# Patient Record
Sex: Female | Born: 1985
Health system: Western US, Academic
[De-identification: ages and names within clinical notes are randomized; demographics above are authoritative.]

## PROBLEM LIST (undated history)

## (undated) ENCOUNTER — Inpatient Hospital Stay: Payer: Self-pay

## (undated) DIAGNOSIS — O1495 Unspecified pre-eclampsia, complicating the puerperium: Secondary | ICD-10-CM

## (undated) HISTORY — DX: Unspecified pre-eclampsia, complicating the puerperium: O14.95

## (undated) HISTORY — PX: NO PAST SURGERIES: SHX2092

---

## 2010-07-05 ENCOUNTER — Inpatient Hospital Stay: Payer: Self-pay

## 2010-07-13 ENCOUNTER — Inpatient Hospital Stay: Payer: Self-pay

## 2012-08-16 ENCOUNTER — Encounter: Payer: Self-pay | Admitting: Obstetrics and Gynecology

## 2012-08-30 ENCOUNTER — Encounter: Payer: Self-pay | Admitting: Maternal & Fetal Medicine

## 2012-09-11 ENCOUNTER — Encounter: Payer: Self-pay | Admitting: Pediatrics

## 2012-09-11 DIAGNOSIS — Z8759 Personal history of other complications of pregnancy, childbirth and the puerperium: Secondary | ICD-10-CM | POA: Insufficient documentation

## 2012-09-27 ENCOUNTER — Encounter: Payer: Self-pay | Admitting: Obstetrics and Gynecology

## 2012-10-03 ENCOUNTER — Encounter: Payer: Self-pay | Admitting: Obstetrics and Gynecology

## 2012-10-31 ENCOUNTER — Inpatient Hospital Stay: Payer: Self-pay

## 2012-10-31 LAB — CBC WITH DIFFERENTIAL/PLATELET
Basophil %: 0.1 %
Eosinophil #: 0.1 10*3/uL (ref 0.0–0.7)
Eosinophil %: 0.6 %
HGB: 11.8 g/dL — ABNORMAL LOW (ref 12.0–16.0)
Lymphocyte %: 15.8 %
MCHC: 36.7 g/dL — ABNORMAL HIGH (ref 32.0–36.0)
MCV: 91 fL (ref 80–100)
Monocyte #: 0.5 x10 3/mm (ref 0.2–0.9)
Monocyte %: 5.9 %
Neutrophil %: 77.6 %
RBC: 3.53 10*6/uL — ABNORMAL LOW (ref 3.80–5.20)
WBC: 9.2 10*3/uL (ref 3.6–11.0)

## 2012-10-31 LAB — APTT: Activated PTT: 29 secs (ref 23.6–35.9)

## 2012-10-31 LAB — FIBRIN DEGRADATION PROD.(ARMC ONLY): Fibrin Degradation Prod.: >40 ug/ml (ref 2.1–7.7)

## 2012-11-01 LAB — CBC WITH DIFFERENTIAL/PLATELET
Basophil %: 0.2 %
Eosinophil %: 0.4 %
Lymphocyte #: 1.4 10*3/uL (ref 1.0–3.6)
MCH: 32.5 pg (ref 26.0–34.0)
MCV: 93 fL (ref 80–100)
Monocyte #: 1 x10 3/mm — ABNORMAL HIGH (ref 0.2–0.9)
Monocyte %: 7 %
Neutrophil %: 82.1 %
Platelet: 129 10*3/uL — ABNORMAL LOW (ref 150–440)
RBC: 3.61 10*6/uL — ABNORMAL LOW (ref 3.80–5.20)

## 2012-11-02 LAB — HEMATOCRIT: HCT: 28.6 % — ABNORMAL LOW (ref 35.0–47.0)

## 2012-11-04 LAB — FIBRINOGEN: Fibrinogen: 302 mg/dL (ref 210–470)

## 2012-11-04 LAB — URINALYSIS, COMPLETE
Bacteria: NONE SEEN
Bilirubin,UR: NEGATIVE
Glucose,UR: NEGATIVE mg/dL (ref 0–75)
Ketone: NEGATIVE
Leukocyte Esterase: NEGATIVE
Nitrite: NEGATIVE
Protein: NEGATIVE
RBC,UR: 92 /HPF (ref 0–5)
Specific Gravity: 1.005 (ref 1.003–1.030)

## 2012-11-04 LAB — COMPREHENSIVE METABOLIC PANEL
Albumin: 2.7 g/dL — ABNORMAL LOW (ref 3.4–5.0)
Alkaline Phosphatase: 115 U/L (ref 50–136)
BUN: 5 mg/dL — ABNORMAL LOW (ref 7–18)
Bilirubin,Total: 0.3 mg/dL (ref 0.2–1.0)
Calcium, Total: 8.6 mg/dL (ref 8.5–10.1)
Chloride: 107 mmol/L (ref 98–107)
EGFR (African American): >60
Osmolality: 275 (ref 275–301)
Potassium: 3.8 mmol/L (ref 3.5–5.1)
SGOT(AST): 57 U/L — ABNORMAL HIGH (ref 15–37)

## 2012-11-04 LAB — CBC
HGB: 12.2 g/dL (ref 12.0–16.0)
MCH: 33.1 pg (ref 26.0–34.0)
MCHC: 36.1 g/dL — ABNORMAL HIGH (ref 32.0–36.0)
MCV: 92 fL (ref 80–100)
RBC: 3.7 10*6/uL — ABNORMAL LOW (ref 3.80–5.20)

## 2012-11-04 LAB — MAGNESIUM: Magnesium: 1.2 mg/dL — ABNORMAL LOW

## 2012-11-04 LAB — APTT: Activated PTT: 31.3 secs (ref 23.6–35.9)

## 2012-11-04 LAB — PHOSPHORUS: Phosphorus: 5 mg/dL — ABNORMAL HIGH (ref 2.5–4.9)

## 2012-11-04 LAB — TROPONIN I: Troponin-I: <0.02 ng/mL

## 2012-11-04 LAB — PROTIME-INR: Prothrombin Time: 12.2 secs (ref 11.5–14.7)

## 2012-11-05 ENCOUNTER — Observation Stay: Payer: Self-pay | Admitting: Obstetrics and Gynecology

## 2012-11-05 LAB — AEROBIC CULTURE

## 2012-11-05 LAB — PIH PROFILE
Anion Gap: 6 — ABNORMAL LOW (ref 7–16)
BUN: 4 mg/dL — ABNORMAL LOW (ref 7–18)
Calcium, Total: 8.1 mg/dL — ABNORMAL LOW (ref 8.5–10.1)
Chloride: 110 mmol/L — ABNORMAL HIGH (ref 98–107)
EGFR (Non-African Amer.): >60
Glucose: 80 mg/dL (ref 65–99)
HCT: 32.3 % — ABNORMAL LOW (ref 35.0–47.0)
MCH: 33.2 pg (ref 26.0–34.0)
MCV: 93 fL (ref 80–100)
Osmolality: 277 (ref 275–301)
Potassium: 3.7 mmol/L (ref 3.5–5.1)
RDW: 13.4 % (ref 11.5–14.5)
WBC: 10.5 10*3/uL (ref 3.6–11.0)

## 2012-11-05 LAB — PROTEIN / CREATININE RATIO, URINE
Creatinine, Urine: 18.4 mg/dL — ABNORMAL LOW (ref 30.0–125.0)
Protein, Random Urine: 7 mg/dL (ref 0–12)

## 2012-11-05 LAB — MAGNESIUM: Magnesium: 5.4 mg/dL — ABNORMAL HIGH

## 2012-11-06 LAB — MAGNESIUM
Magnesium: 5.9 mg/dL — ABNORMAL HIGH
Magnesium: 6.7 mg/dL — ABNORMAL HIGH

## 2013-03-14 ENCOUNTER — Encounter: Payer: Self-pay | Admitting: Maternal & Fetal Medicine

## 2013-03-14 LAB — CBC WITH DIFFERENTIAL/PLATELET
Basophil #: 0 10*3/uL (ref 0.0–0.1)
Basophil %: 0.4 %
Eosinophil #: 0.1 10*3/uL (ref 0.0–0.7)
Eosinophil %: 1 %
HCT: 36.6 % (ref 35.0–47.0)
HGB: 12.9 g/dL (ref 12.0–16.0)
Lymphocyte #: 1.6 10*3/uL (ref 1.0–3.6)
Lymphocyte %: 19.7 %
MCH: 31.3 pg (ref 26.0–34.0)
MCHC: 35.1 g/dL (ref 32.0–36.0)
MCV: 89 fL (ref 80–100)
MONO ABS: 0.5 x10 3/mm (ref 0.2–0.9)
MONOS PCT: 5.8 %
Neutrophil #: 5.9 10*3/uL (ref 1.4–6.5)
Neutrophil %: 73.1 %
Platelet: 146 10*3/uL — ABNORMAL LOW (ref 150–440)
RBC: 4.1 10*6/uL (ref 3.80–5.20)
RDW: 13.1 % (ref 11.5–14.5)
WBC: 8 10*3/uL (ref 3.6–11.0)

## 2013-10-16 ENCOUNTER — Inpatient Hospital Stay: Payer: Self-pay | Admitting: Obstetrics and Gynecology

## 2013-10-16 LAB — CBC WITH DIFFERENTIAL/PLATELET
BASOS ABS: 0.1 10*3/uL (ref 0.0–0.1)
BASOS PCT: 0.7 %
EOS PCT: 0.7 %
Eosinophil #: 0.1 10*3/uL (ref 0.0–0.7)
HCT: 37.9 % (ref 35.0–47.0)
HGB: 12.6 g/dL (ref 12.0–16.0)
LYMPHS ABS: 2.1 10*3/uL (ref 1.0–3.6)
LYMPHS PCT: 20.4 %
MCH: 31.7 pg (ref 26.0–34.0)
MCHC: 33.3 g/dL (ref 32.0–36.0)
MCV: 95 fL (ref 80–100)
Monocyte #: 0.7 x10 3/mm (ref 0.2–0.9)
Monocyte %: 7.2 %
NEUTROS ABS: 7.4 10*3/uL — AB (ref 1.4–6.5)
Neutrophil %: 71 %
Platelet: 128 10*3/uL — ABNORMAL LOW (ref 150–440)
RBC: 3.97 10*6/uL (ref 3.80–5.20)
RDW: 13.7 % (ref 11.5–14.5)
WBC: 10.5 10*3/uL (ref 3.6–11.0)

## 2013-10-16 LAB — COMPREHENSIVE METABOLIC PANEL
ANION GAP: 9 (ref 7–16)
Albumin: 2.8 g/dL — ABNORMAL LOW (ref 3.4–5.0)
Alkaline Phosphatase: 331 U/L — ABNORMAL HIGH
BUN: 9 mg/dL (ref 7–18)
Bilirubin,Total: 0.4 mg/dL (ref 0.2–1.0)
CHLORIDE: 108 mmol/L — AB (ref 98–107)
Calcium, Total: 8.7 mg/dL (ref 8.5–10.1)
Co2: 23 mmol/L (ref 21–32)
Creatinine: 0.49 mg/dL — ABNORMAL LOW (ref 0.60–1.30)
EGFR (African American): >60
EGFR (Non-African Amer.): >60
GLUCOSE: 82 mg/dL (ref 65–99)
Osmolality: 277 (ref 275–301)
Potassium: 4 mmol/L (ref 3.5–5.1)
SGOT(AST): 31 U/L (ref 15–37)
SGPT (ALT): 28 U/L
SODIUM: 140 mmol/L (ref 136–145)
TOTAL PROTEIN: 6.8 g/dL (ref 6.4–8.2)

## 2013-10-17 LAB — HEMATOCRIT: HCT: 35.4 % (ref 35.0–47.0)

## 2014-04-25 NOTE — Consult Note (Signed)
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 by 6 week ultrasound with a EDC of 02/15/2013  uncomplicated pregnancy thus far   Past Obstetrical Hx 07/2010 spontaneous vaginal delivery female 8 lb 14 oz antepartum PUPPrash, postpartum preeclampsia requiring medicaiton, brief ICU stay  SAB  08/2009   Home Medications: Medication Instructions Status  Prenatal Multivitamins oral tablet 1 tab(s) orally once a day Active   Allergies:   Penicillin: Rash  Vital Signs/Notes:  Nursing Vital Signs:  **Vital Signs.:   14-Aug-14 11:57  Vital Signs Type Routine  Temperature Temperature (F) 98.3  Celsius 36.8  Temperature Source oral  Pulse Pulse 81  Respirations Respirations 18  Systolic BP Systolic BP 113  Diastolic BP (mmHg) Diastolic BP (mmHg) 68  Mean BP 83  Pulse Ox % Pulse Ox % 100  Pulse Ox Activity Level  At rest  Oxygen Delivery Room Air/ 21 %   Perinatal Consult:  LMP 29-Apr-2012   PGyn Hx conceived first cycle after OCPS    Past Medical History cont'd negative   PSurg Hx none   FHx colon cancer , Type 2 DM   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   Soc Hx married   Review Of Systems:  Subjective mild nausea   Medications/Allergies Reviewed Medications/Allergies reviewed     Additional Lab/Radiology Notes Urine C&S GBS pos   Impression/Recommendations:  Impression [facility]UP at 13 6/7 weeks [facility] - 50% risk of aneuploidy , increased risk of Congenital heart disease  h/o postpartum preeclampsia requiring BP meds  h/o macrosomia- LGA infant   Recommendations Genetic testing - cell free DNA today - f/u 2 weeks discuss amnio then  fetal echo at 19-22 weeks full gentics note to follow offered baby aspirin for h/o preeclampsia  early glucola after nausea passes   Plan:  Genetic  Counseling yes   Prenatal Diagnosis Options Amniocentesis, cell free DNA    Total Time Spent with Patient 15 minutes   >50% of visit spent in couseling/coordination of care yes   Office Use Only [id number]  Genetic Counseling (30 min unit), 99241  Level 1 (15min) NEW office consult prob focused   Coding Description: FETAL - 1st TRIMESTER INDICATION(S).   GENETIC SCREENING/COUNSELING INDICATION(S).   Abn First Trimester Screen or thick NT.  Electronic Signatures: Rondall AllegraLivingston, Kahlie Deutscher [place] (MD)  (Signed 14-Aug-14 13:59)  Authored: Referral, Home Medications, Allergies, Vital Signs/Notes, Consult, Exam, Lab/Radiology Notes, Impression, Plan, Billing, Coding Description   Last Updated: 14-Aug-14 13:59 by Rondall AllegraLivingston, Areon Cocuzza [place] (MD)

## 2014-04-26 NOTE — Consult Note (Signed)
Referral Information:  Reason for Referral History of prior pregnancy complicated by stillbirth in setting of cystic hygroma   Referring Physician [facility]   Prenatal Hx Elizabeth Kramer is a 29 year-old G4 P1111 at 8 5/7 weeks [facility] who presents for recommendations for this pregnancy. Our group had seen Elizabeth Kramer during her most recent pregnancy in 2014. She was found to have a large cystic hygroma. She had normal cell-free fetal DNA testing, normal anatomy scan and normal fetal echo. She then presented at 25 weeks with fetal demise.  She was induced and had an uncomplicated delivery.  The fetus had skin sloughing and hydropic changes.  The placenta was also hydropic.  Microarray testing on the fetus was normal. Autopsy was declined. Placental pathology demonstrated hydropic changes and no evidence of viral cytopathic changes, chorioamnionitis or funisitis.  Antibody screen was negative. Parvovirus testing suggested protection (IgG positive, IgM negative). MCV normal. Anticardiolipin antibody negative.  Elizabeth Kramer is currently pregnant and states things are going well. She denies vaginal bleeding or abdominal pain.   Past Obstetrical Hx G4 P1111 2011: First trimester SAB, no D&C, no complications 2012: spontaneous vaginal delivery at 39 6/7 weeks, spontaneous labor, female, 8 pounds 14 ounces, no complications at delivery but re-admitted on PPD 6 with preeclampsia. BP 194/109, required ICU care and 6 weeks of nifedipine 2014: See HPI.  Cystic hydroma. Normal anat scan. Normal fetal echo. Stillbirth at 25 weeks. Hydrops. Normal microarray. No evidence of triploidy on microarray. Readmitted PPD 3 with severe preeclampsia.   Home Medications:  Medication Instructions Status  Prenatal Multivitamins oral tablet 1 tab(s) orally once a day Active   Allergies:   Penicillin: Rash  Vital Signs/Notes:  Nursing Vital Signs:  **Vital Signs.:   12-Mar-15 12:47  Pulse Pulse 79  Systolic BP  Systolic BP 116  Diastolic BP (mmHg) Diastolic BP (mmHg) 59   Perinatal Consult:  PGyn Hx Denies history of abnormal paps or STDs    Past Medical History cont'd Denies a history of chronic hypertension, diabetes or endocrine disorders Two pregnancies complicated by postpartum severe preeclampsia   PSurg Hx None   FHx Denies FH of birth defects, genetic disorders, mental retardation, or recurrent misscarriage   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   Fever/Chills No    Cough No    Abdominal Pain No    Nausea/Vomiting No    Impression/Recommendations:  Impression 29 year-old G4 P1111 at 8 3/7 weeks with most recent pregnancy complicated by stillbirth at 25 weeks in setting of cystic hygroma and two pregnancies complicated by postpartum severe preeclampsia.   Recommendations 1. Prior pregnancy with cystic hygroma, hydrops and stillbirth.  The presentation at time of delivery was consistent with hydrops. As her antibody screen was negative, this represents non-immune hydrops, for which the differential diagnosis is large.  She decline autopsy but had a normal anatomy scan and normal fetal echo.  Her MCV was normal, so alpha thalassemia is unlikley,  Her parvovirus testing was suggestive of protection, thus parvovirus-mediated severe anemia is unlikely.  Euploid fetuses with cystic hygromas have an approximate 15-20% risk for fetal death.  The etiology of the cystic hygroma and resultant non-immune hydrops is not clear. This could represent a genetic condition (ie single gene disorder). As the etiology is not know, her recurrence risk could be extremely  low or up to 25%-50% if this represented a recessive or dominant condition.  Elizabeth Kramer asked about the utiligy of cell-free fetal DNA testing. We discussed that the test would not  be able to screen for whatever the condition was that lead to her cystic hygroma and hydrops as the microarray testing from that pregnancy was normal. She is thinking about proceeding with cell-free DNA testing. Recs below.  2. Severe postpartum preeclampsia.  Postpartum preeclampsia is uncommon.  Preeclampsia though is common in the setting of fetal hydrops (mirror syndrome). We discussed signs and symptoms of preeclampsia, need for close surveillance during pregnancy and the immediate postpartum period and the use of daily baby aspirin, starting at 12 weeks to decrease the risk of recurrent preeclampsia.   Plan:  Comment/Plan Recs: -First trimester screen to screen for cystic hygroma -Daily baby aspirin to start at 12 weeks -Detailed ultrasound at 17-18 weeks -We have sent antiphospholipid antibodies (anticardiolipin antibodies, anti beta 2 glycoprotein antibodies and lupus anticoagulant), CBC to check MCV and hemoglobin electrophoresis -Pt is considering cell-free fetal DNA testing -Follow fetal growth with regular ultrasound assessment during the pregnancy (screen for recurrent hydrops) -Ensure antibody screen is still negative    Total Time Spent with Patient 60 minutes   >50% of visit spent in couseling/coordination of care yes   Office Use Only 99244  Level 4 ( ) NEW office consult low complexity   Coding Description: OTHER: History of stillbirth (hydrops) and preeclampsia.  Electronic Signatures: Allicia Culley, [place] (MD)  (Signed 12-Mar-15 13:48)  Authored: Referral, Home Medications, Allergies, Vital Signs/Notes, Consult, Exam, Impression, Plan, Billing, Coding Description   Last Updated: 12-Mar-15 13:48 by Jackeline Gutknecht, [place] (MD)

## 2014-05-13 NOTE — H&P (Signed)
L&D Evaluation:  History:  HPI 29 yo G3P1011 [facility] 6745w5d [facility] presenting with no fetal movement since yesterday evening.  She has a home doppler and got heartones yesterday evening.  Also reports irregular contractions up 18-hr.  No LOF, no VB, no abdominal trauma.  [facility] cystic hygroma noted at 13 week 1st trimester NT scan.  She subsequently underwent cell free fetal DNA which returned low risk and XY.  Follow up ultrasounds did not show any other anatomic abnormalities and [facility]  [facility] revealed minimal residual fluid in the lateral posterior nuchal fold  The patient does have a history of prior preeclampsia with baseline 24-hr urine obtained at start of pregnancy 132   Presents with decreased fetal movement   Patient's Medical History No Chronic Illness   Patient's Surgical History none   Medications Pre Natal Vitamins    Allergies NKDA   Social History none    Family History Non-Contributory    ROS:  ROS All systems were reviewed.  HEENT, CNS, GI, GU, Respiratory, CV, Renal and Musculoskeletal systems were found to be normal.   Exam:  Vital Signs stable    General no apparent distress   Mental Status clear    Chest no increased work of breathing    Abdomen gravid, non-tender   Estimated Fetal Weight Average for gestational age   Fetal Position vtx on US   Edema no edema    Pelvic no external lesions, cervix closed and thick   Mebranes Intact   FHT absent   Other Bedside US reveals single non-viable IUP, no FHTs confirmed on doppler or color flow, fetal ascites is noted.   Impression:  Impression IUFD at 2545w5d   Plan:  Plan monitor contractions and for cervical change, monitor BP, fluids   Comments 1) IUFD     - discussed IUFD and cytogenetics given cystichygroma, we discussed false negative are possible on cell free fetal DNA      - IOL with  misoprostil     - morphine PCA for anagesia     - KB     - routine prenatal labs  2) PNL A+ / ABSC neg / RI / VZI / HIV neg / HBsAg neg / RPR NR / GBS bacteruria / early 1-hr 121 / Baseline 24-hr urine 132mg    Electronic Signatures: Lorrene ReidStaebler, Leno Mathes M (MD)  (Signed 29-Oct-14 14:47)  Authored: L&D Evaluation   Last Updated: 29-Oct-14 14:47 by Lorrene ReidStaebler, Shanti Agresti M (MD)

## 2014-05-13 NOTE — H&P (Signed)
L&D Evaluation:  History:  HPI -CC: worsening UCs -HPI: 29 y/o [id number] @ 39/4 with the above CC. Preg c/b h/o preterm IUFD due to non immune hydrops, GBS pos and h/o PP pre-eclampsia x 2.  No VB, LOF or decreased FM.   Medications Pre Natal Vitamins   Allergies PCN, rash   Social History none   Exam:  Vital Signs AF VS normal and stable   General no apparent distress   Pelvic 0330 4-5cm-->0550 7/90/0   Mebranes Intact   FHT 140 baseline, +accels, no decels, mod var   Ucx irregular   Plan:  Plan pt doing well   Comments *IUP: category I tracing -10/17 @ 35/2: 2971gm, 69%, AC 97%, nl AFI, VTX -normal 1hr GTT and prior infant 4025gm *Term labor: d/w pt likely augmentation with pit or AROM once 2nd dose of abx is on board. will inform Dr. Bonney AidStaebler of progress for delivery *Analgesia: s/p epidural *GBS pos: 2nd dose ancef at approx 11am *Gestational thrombocytopenia: normal CMP. likely need to keep until PPD#2 and see with close follow up on hospital discharge.  A pos//RI/VI/rpr pending/hiv neg/hepB neg/tdap UTD/pap: need to look up in EMR   Electronic Signatures: Alger BingPickens, Chelli Yerkes (MD)  (Signed 14-Oct-15 07:19)  Authored: L&D Evaluation   Last Updated: 14-Oct-15 07:19 by Seymour BingPickens, Lavone Barrientes (MD)

## 2015-07-06 IMAGING — US US OB DETAIL+14 WK - NRPT [facility]
1 series · 14 of 28 positions shown · non-contrast
Comparison: none

[Series 1: us ob detail+14 wk - nrpt [facility] · 0.28mm/px · 14 of 86 slices shown]
[im 4/86]
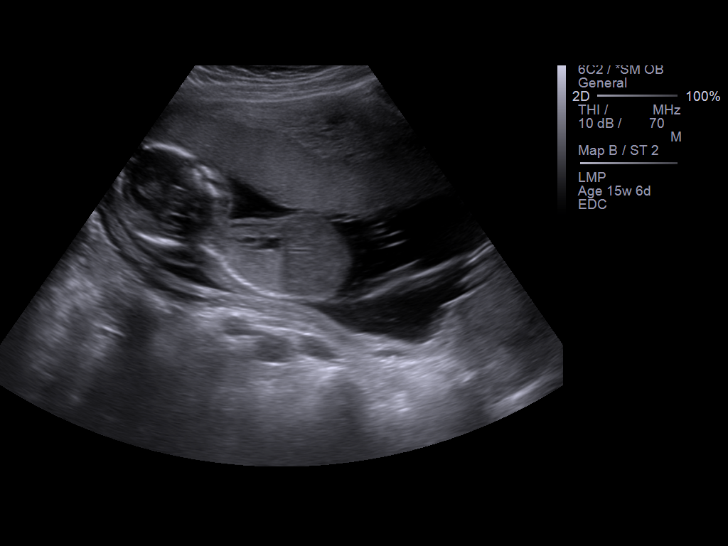
[im 10/86]
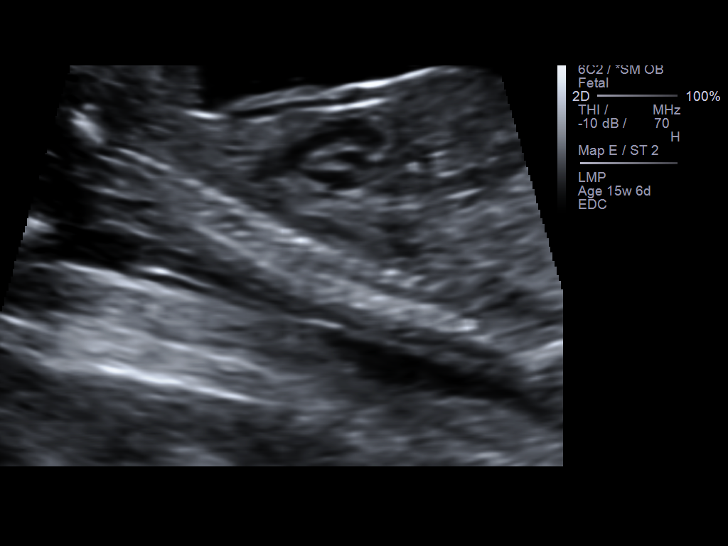
[im 16/86]
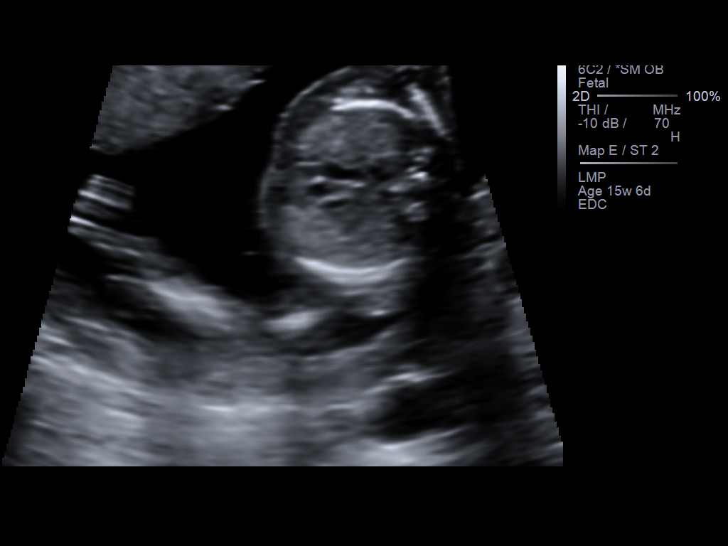
[im 23/86]
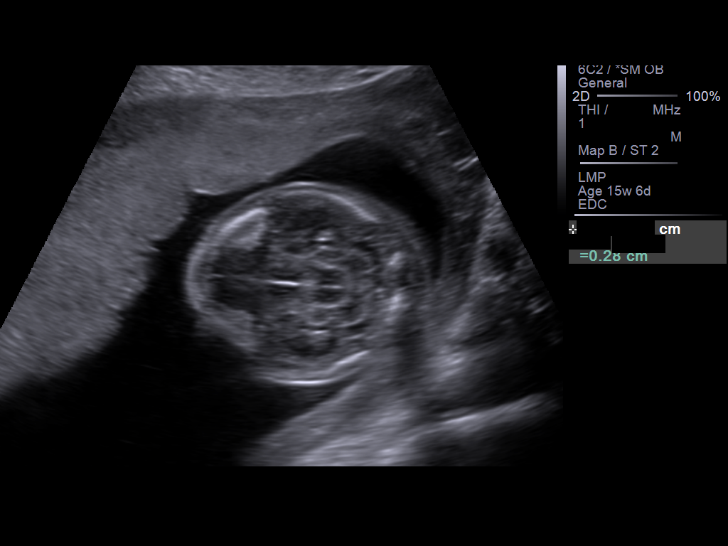
[im 29/86]
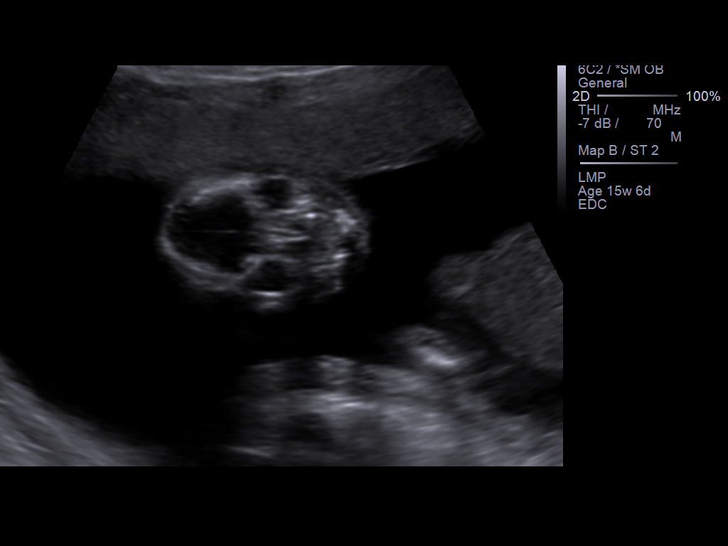
[im 35/86]
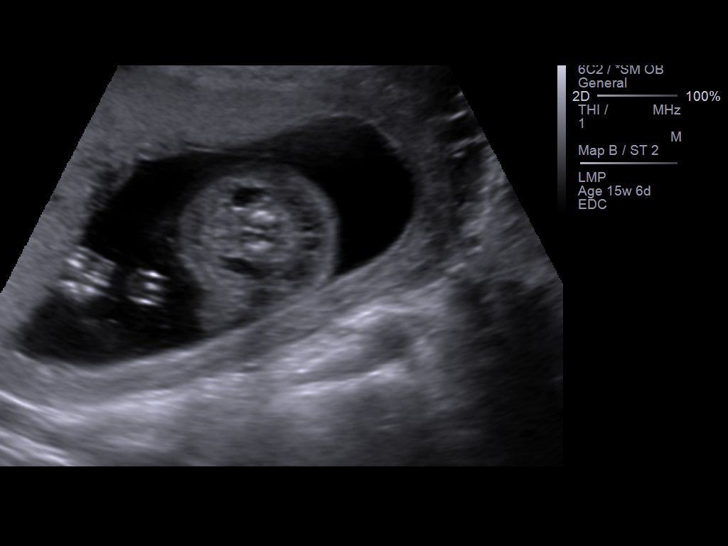
[im 41/86]
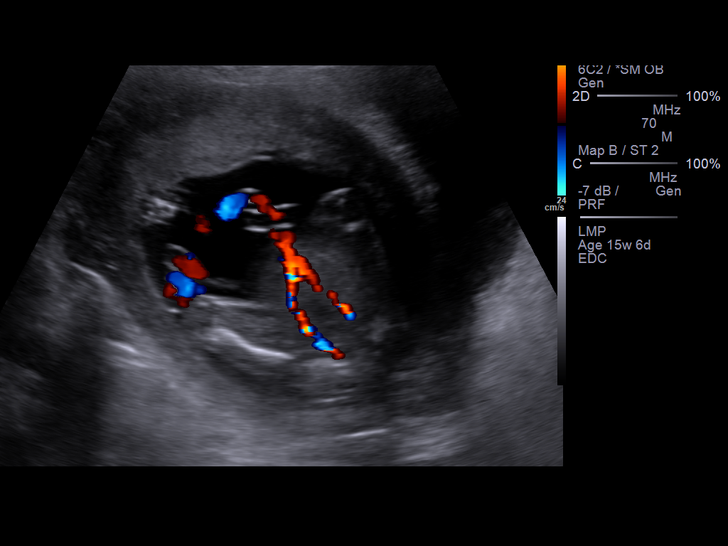
[im 48/86]
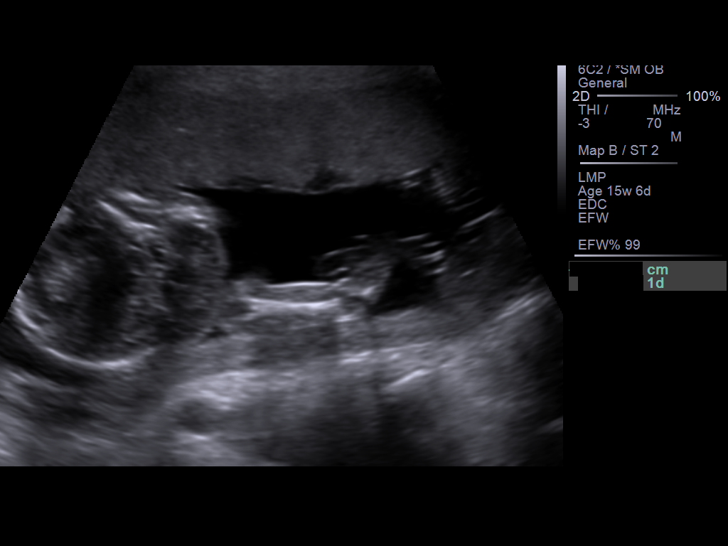
[im 54/86]
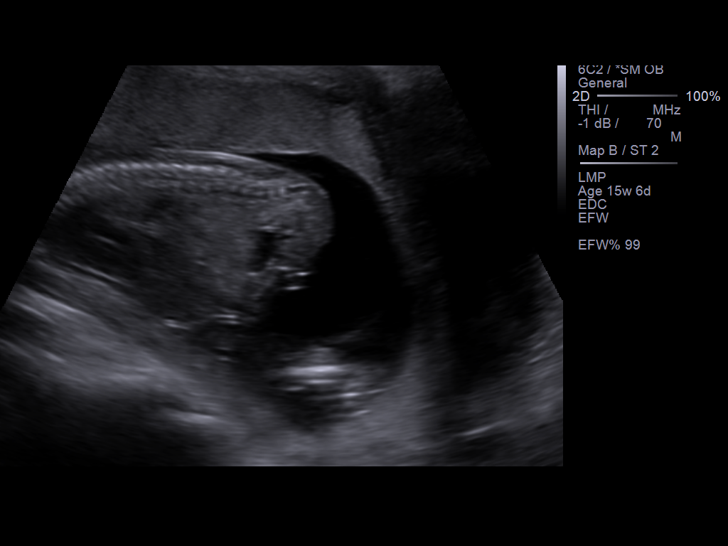
[im 60/86]
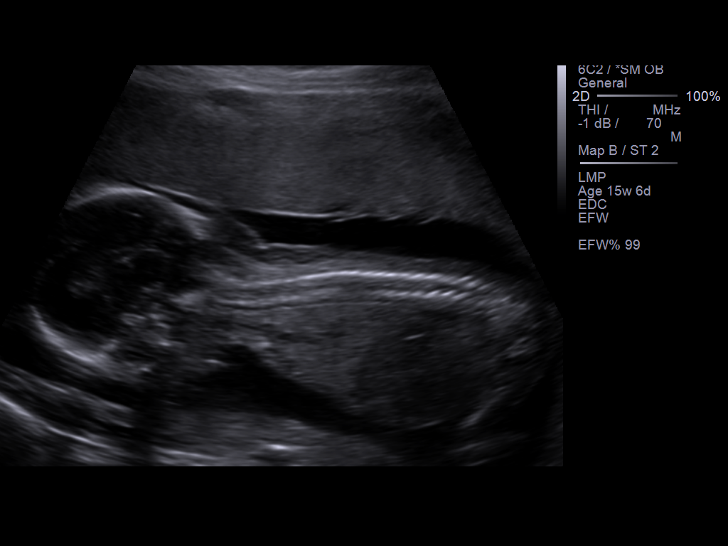
[im 67/86]
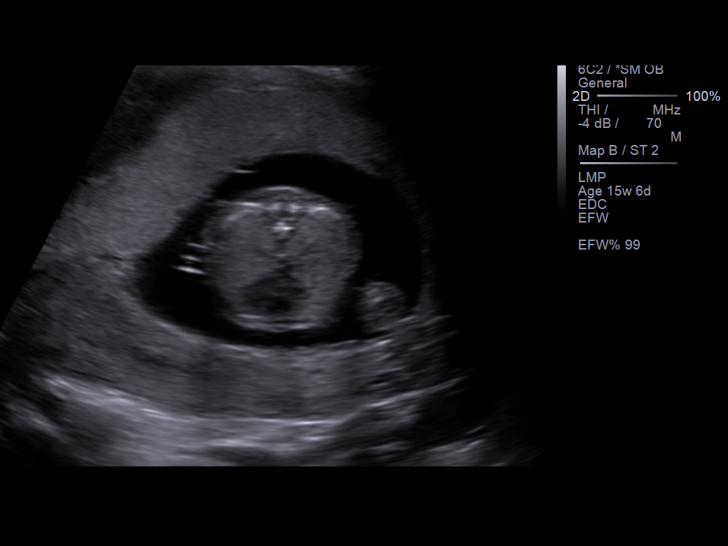
[im 73/86]
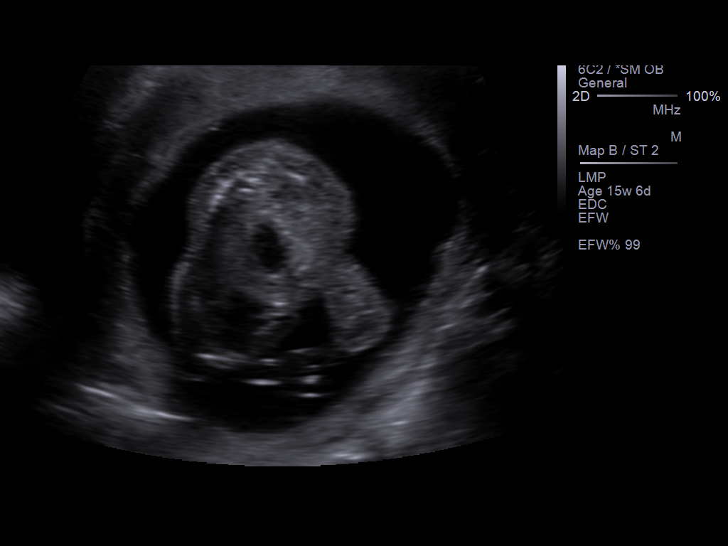
[im 79/86]
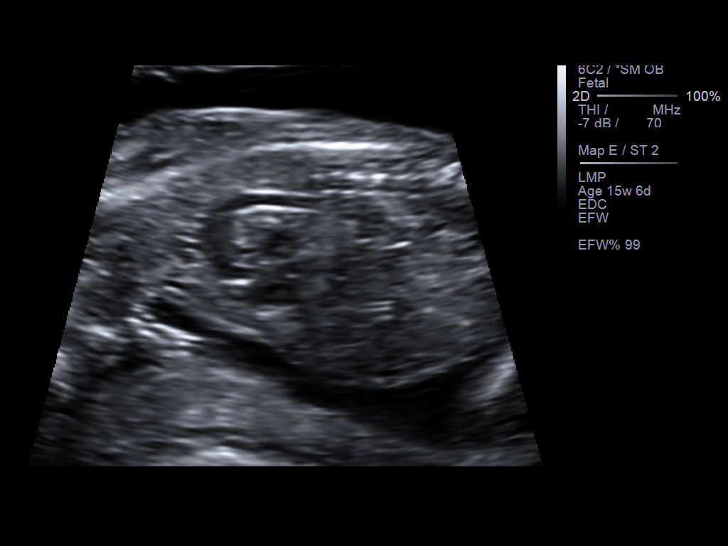
[im 86/86]
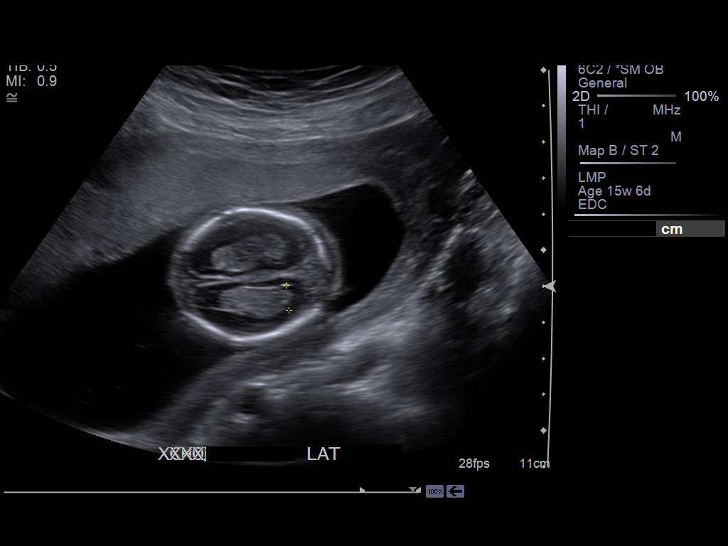

[14 of 28 positions shown; findings below may reference images not displayed]

IMAGES IMPORTED FROM THE SYNGO WORKFLOW SYSTEM
NO DICTATION FOR STUDY

## 2015-08-03 IMAGING — US US OB FOLLOW-UP - NRPT [facility]
1 series · 14 of 28 positions shown · non-contrast
Comparison: none

[Series 1: us ob follow-up - nrpt [facility] · 0.28mm/px · 14 of 50 slices shown]
[im 2/50]
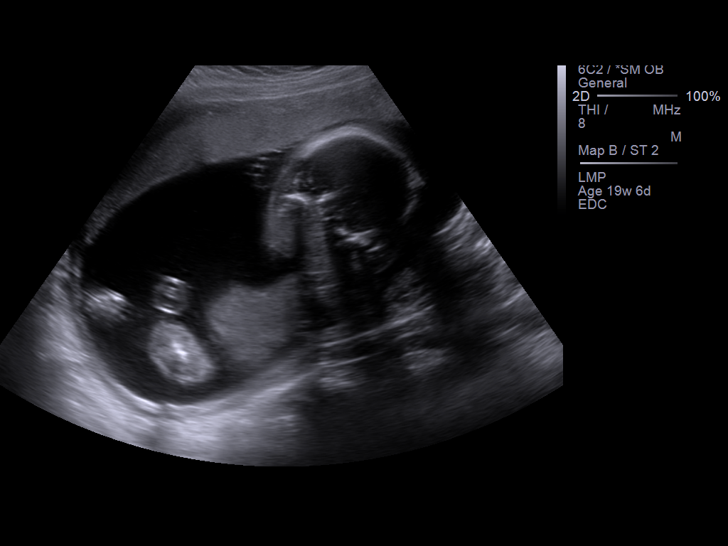
[im 6/50]
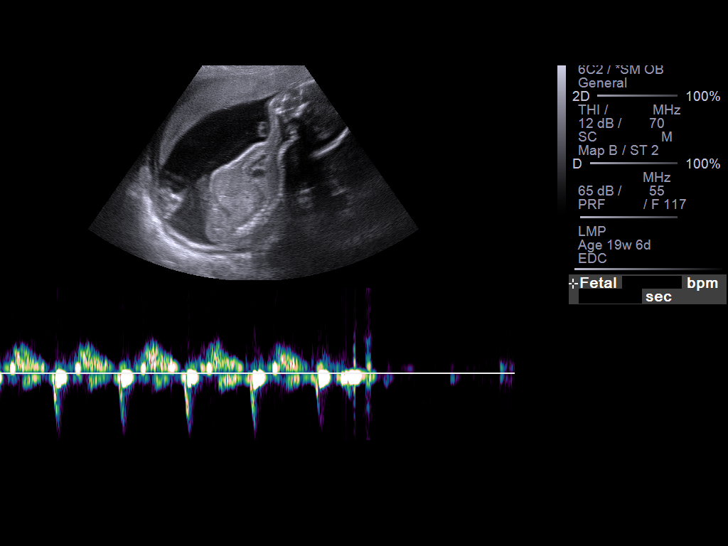
[im 10/50]
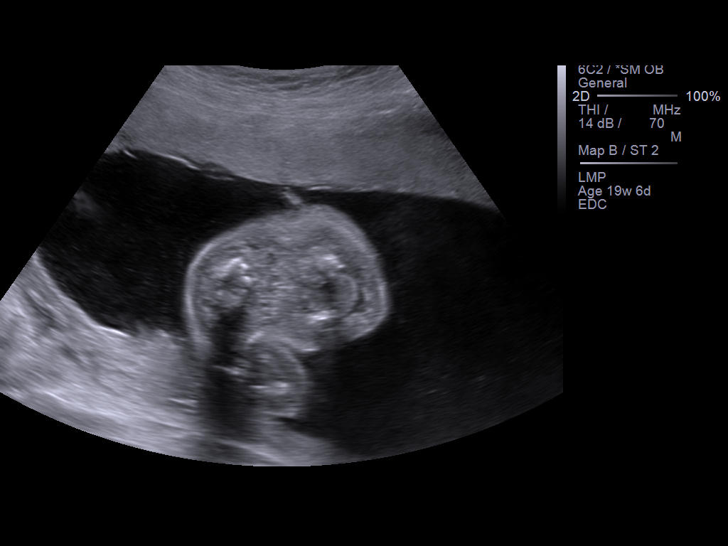
[im 13/50]
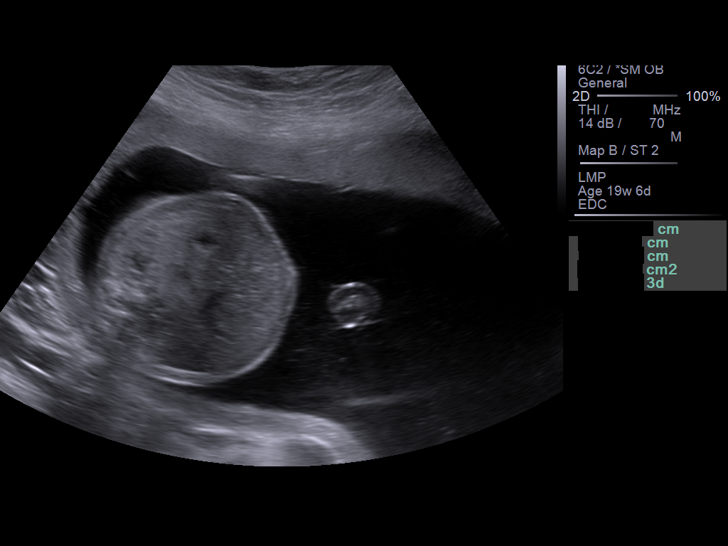
[im 17/50]
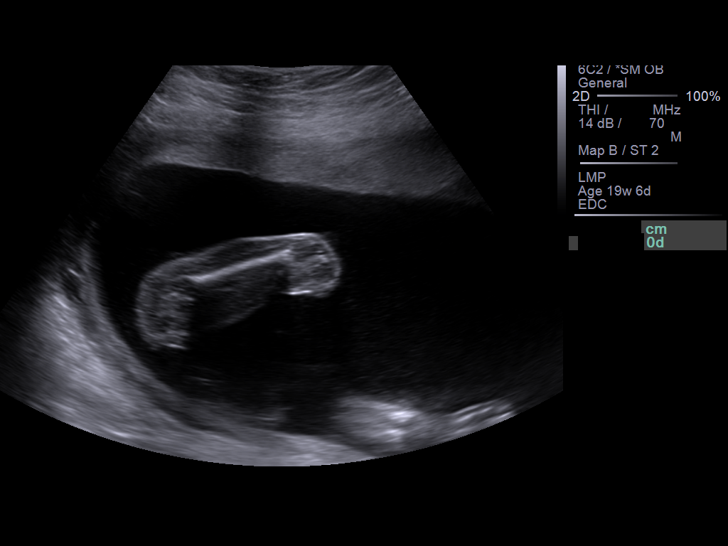
[im 20/50]
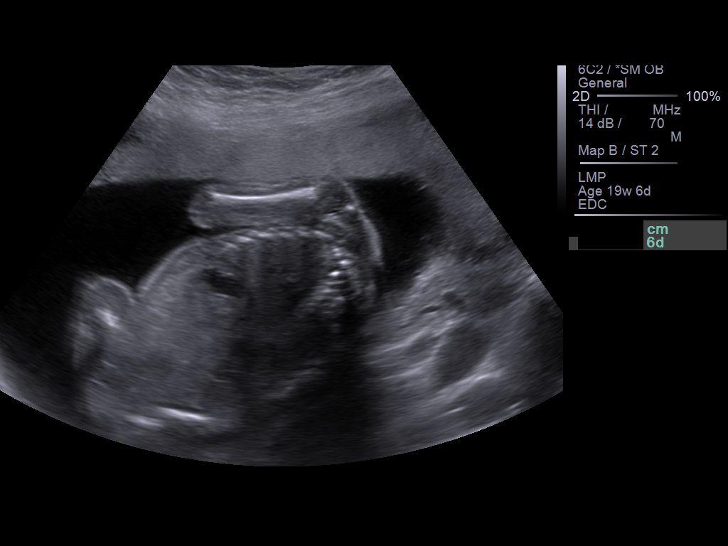
[im 24/50]
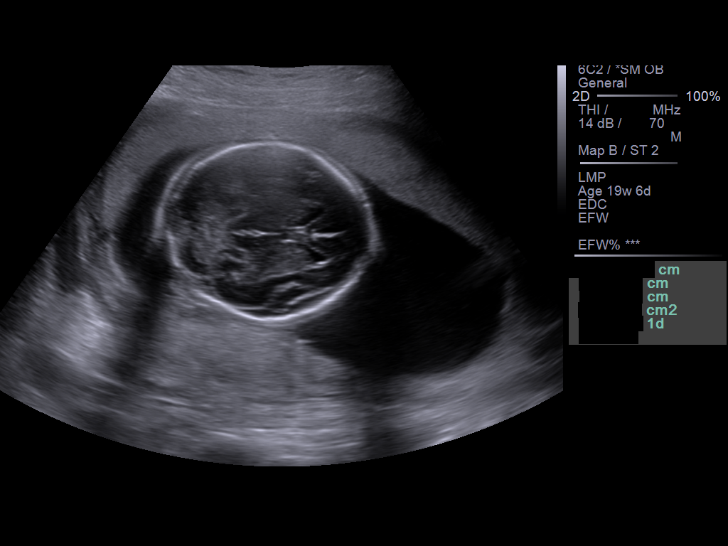
[im 28/50]
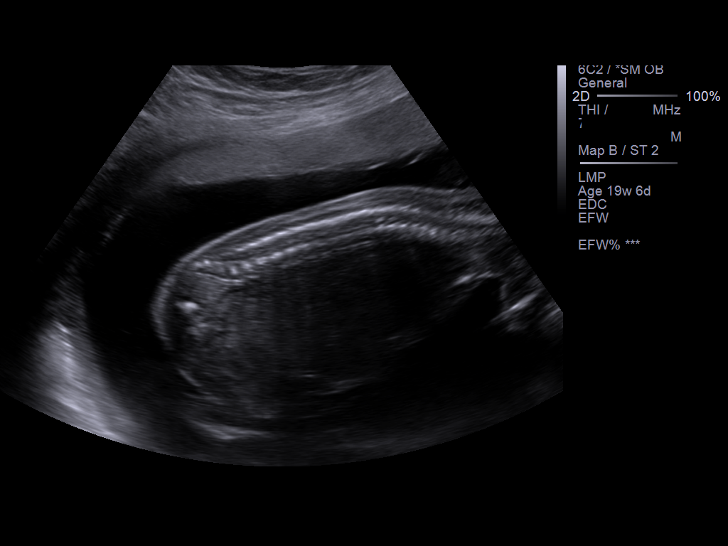
[im 31/50]
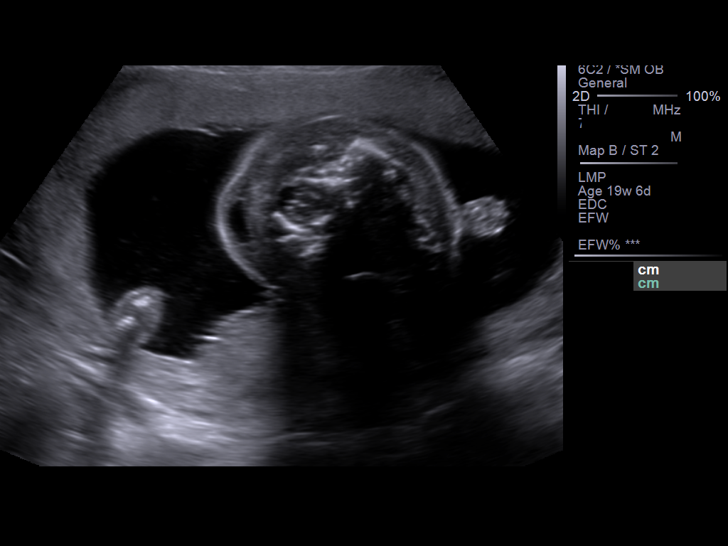
[im 35/50]
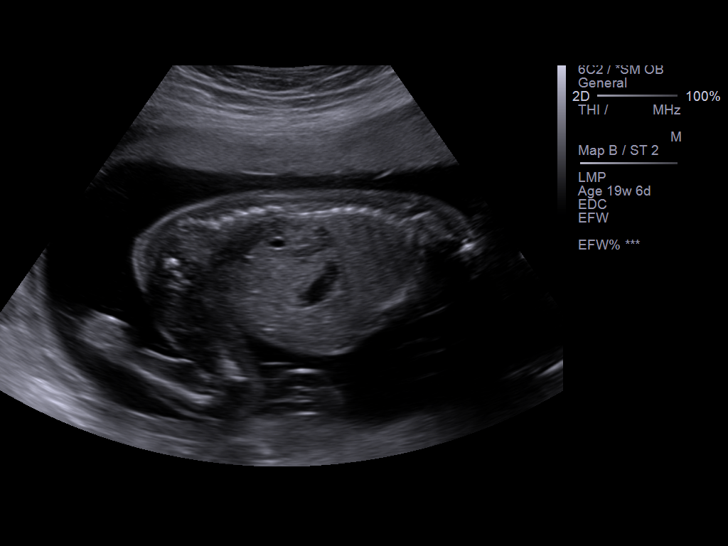
[im 39/50]
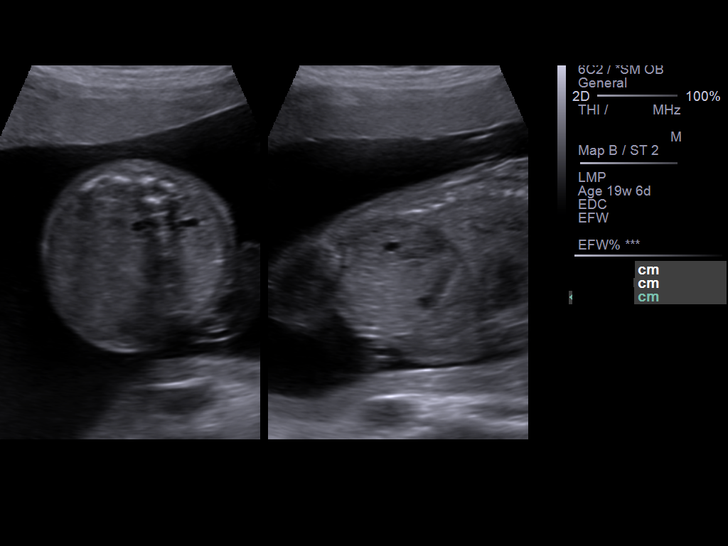
[im 42/50]
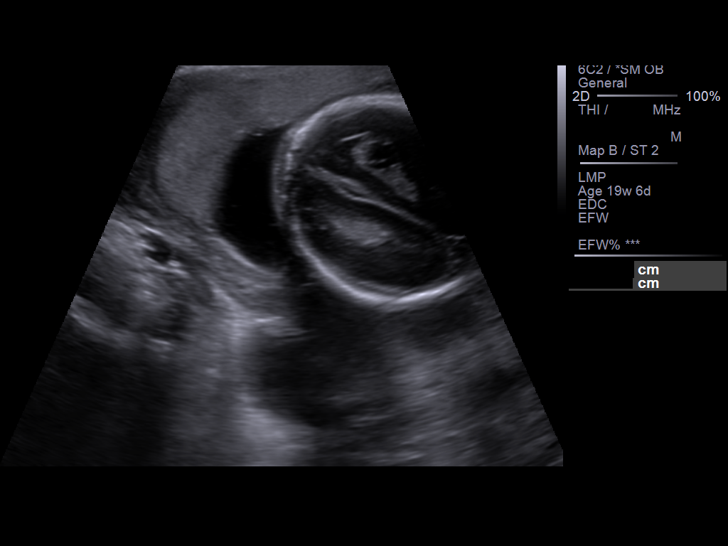
[im 46/50]
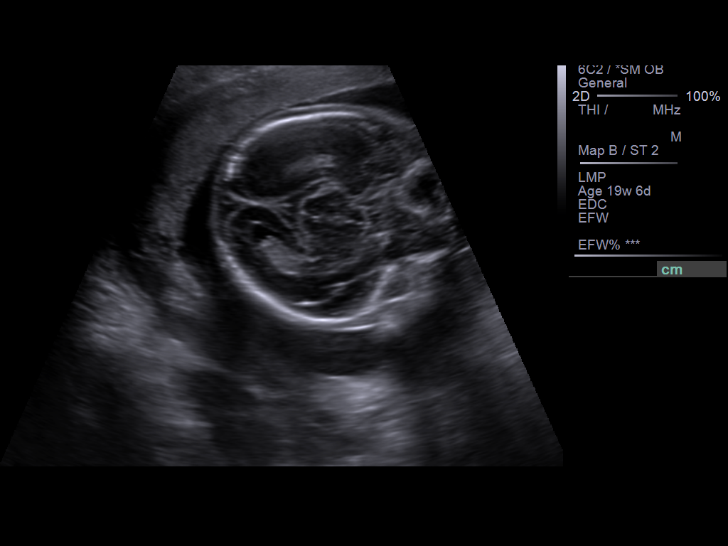
[im 50/50]
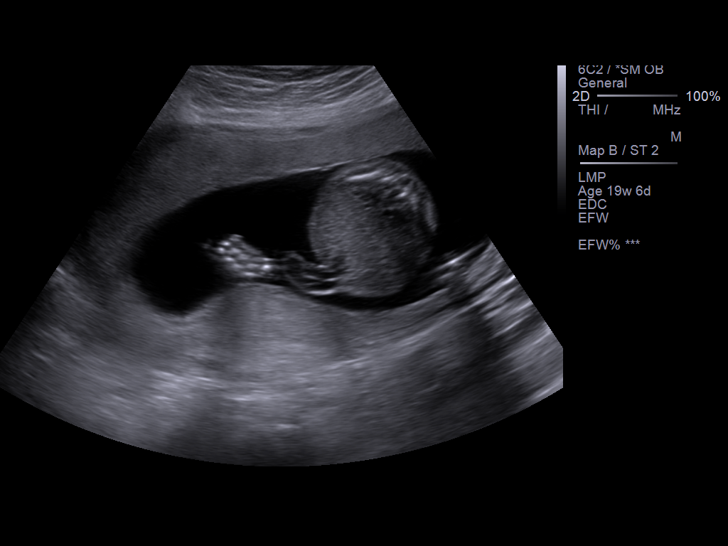

[14 of 28 positions shown; findings below may reference images not displayed]

IMAGES IMPORTED FROM THE SYNGO WORKFLOW SYSTEM
NO DICTATION FOR STUDY

## 2015-09-10 IMAGING — CR DG CHEST 1V PORT
1 series · 1 of 1 positions shown · non-contrast
Comparison: none

REASON FOR EXAM: Altered Mental Status
COMMENTS:

PROCEDURE:     DXR - DXR PORTABLE CHEST SINGLE VIEW  - November 04, 2012  [DATE]
RESULT:     The lungs are clear. The cardiac silhouette and visualized bony
skeleton are unremarkable.

[ap]
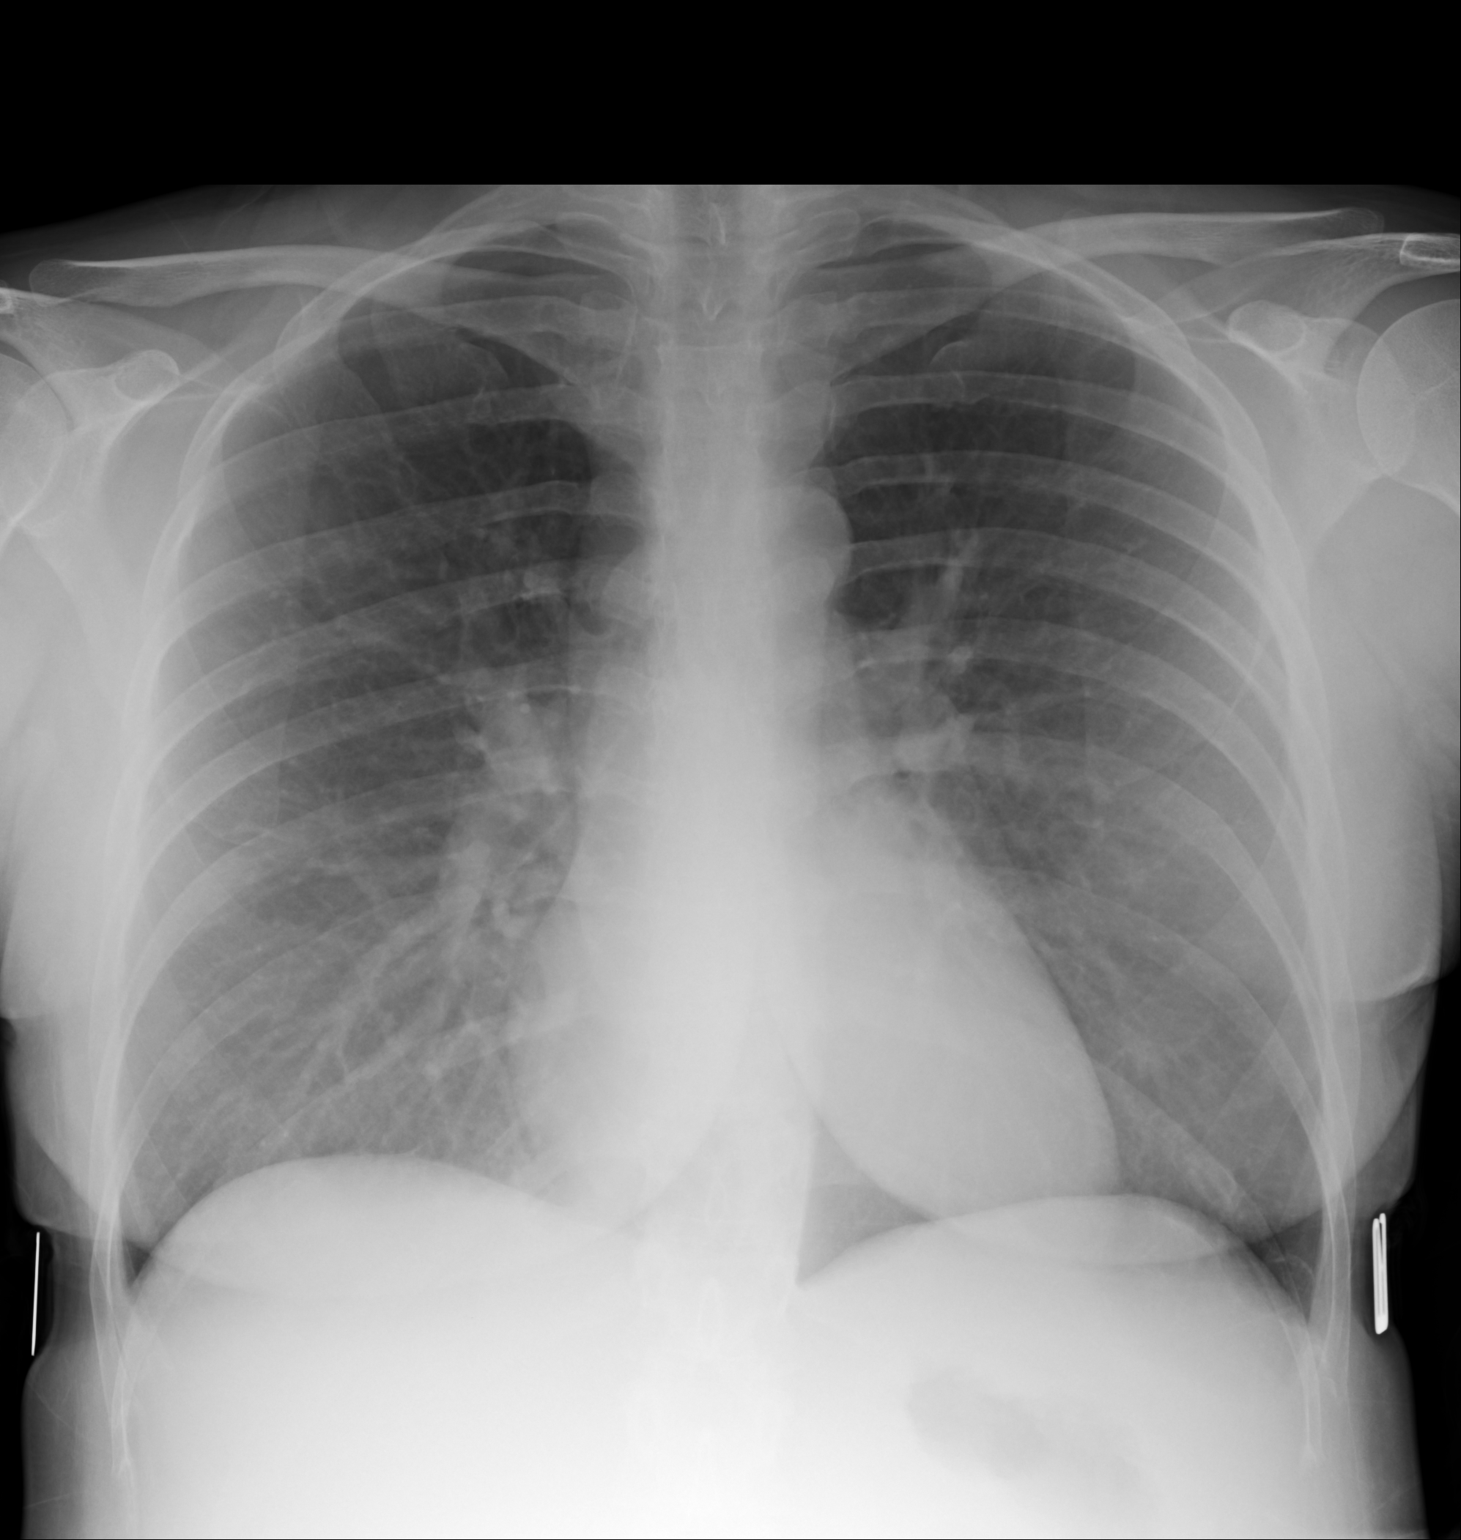

[1 of 1 positions shown; findings below may reference images not displayed]

IMPRESSION: 1. Chest radiograph without evidence of acute cardiopulmonary disease.

## 2016-01-04 NOTE — L&D Delivery Note (Signed)
Delivery Note At 9:21 PM a viable female was delivered via Vaginal, Spontaneous (Presentation: ROA ).  APGAR: 9, 9; weight pending.   Placenta status: spontaneous, intact  Cord: 3VC without complications.  Cord pH: N/A  Anesthesia:  epidural Episiotomy: None Lacerations:  none Suture Repair: none Est. Blood Loss (mL):  200mL  Mom to postpartum.  Baby to Couplet care / Skin to Skin.  Elizabeth Kramer 01/01/2017, 9:32 PM

## 2016-06-01 ENCOUNTER — Encounter: Payer: Self-pay | Admitting: Advanced Practice Midwife

## 2016-06-03 ENCOUNTER — Encounter: Payer: Self-pay | Admitting: Advanced Practice Midwife

## 2016-06-03 ENCOUNTER — Ambulatory Visit (INDEPENDENT_AMBULATORY_CARE_PROVIDER_SITE_OTHER): Payer: 59 | Admitting: Advanced Practice Midwife

## 2016-06-03 VITALS — BP 114/70 | Ht 66.0 in | Wt 131.0 lb

## 2016-06-03 DIAGNOSIS — Z3490 Encounter for supervision of normal pregnancy, unspecified, unspecified trimester: Secondary | ICD-10-CM

## 2016-06-03 DIAGNOSIS — Z113 Encounter for screening for infections with a predominantly sexual mode of transmission: Secondary | ICD-10-CM

## 2016-06-03 DIAGNOSIS — Z3480 Encounter for supervision of other normal pregnancy, unspecified trimester: Secondary | ICD-10-CM

## 2016-06-03 NOTE — Progress Notes (Signed)
NOB today.  

## 2016-06-03 NOTE — Patient Instructions (Signed)

## 2016-06-03 NOTE — Progress Notes (Signed)
New Obstetric Patient H&P    Chief Complaint: "Desires prenatal care"   History of Present Illness: Patient is a 31 y.o. [id number] Not Hispanic or Latino female, LMP 04/02/2016 presents with amenorrhea and positive home pregnancy test. Based on her  LMP, her EDD is Estimated Date of Delivery: 01/07/17 and her EGA is [redacted]w[redacted]d. Cycles are 5. days, regular, and occur approximately every : 28 days. Her last pap smear was 1 years ago and was no abnormalities.    She had a urine pregnancy test which was positive 4 week(s)  ago. Her last menstrual period was normal and lasted for  5 day(s). Since her LMP she claims she has experienced breast tenderness, fatigue, nausea. She denies vaginal bleeding. Her past medical history is noncontributory. Her prior pregnancies are notable for postpartum preeclampsia, fetal cystic hygroma with G2- stillborn 25 weeks  Since her LMP, she admits to the use of tobacco products  no She claims she has gained   6 pounds since the start of her pregnancy.  There are cats in the home in the home  no  She admits close contact with children on a regular basis  yes  She has had chicken pox in the past yes She has had Tuberculosis exposures, symptoms, or previously tested positive for TB   no Current or past history of domestic violence. no  Genetic Screening/Teratology Counseling: (Includes patient, baby's father, or anyone in either family with:)   1. Patient's age >/= 74 at [facility]  no 2. Thalassemia ([place], [place], Mediterranean, or Asian background): MCV<80  no 3. Neural tube defect (meningomyelocele, spina bifida, anencephaly)  no 4. Congenital heart defect  no  5. Down syndrome  no 6. Tay-Sachs (Jewish, [place]  no 7. Canavan's Disease  no 8. Sickle cell disease or trait (African)  no  9. Hemophilia or other blood disorders  no  10. Muscular dystrophy  no  11. Cystic fibrosis  no  12. Huntington's Chorea  no  13. Mental retardation/autism  no 14. Other  inherited genetic or chromosomal disorder  no 15. Maternal metabolic disorder (DM, PKU, etc)  no 16. Patient or FOB with a child with a birth defect not listed above no  16a. Patient or FOB with a birth defect themselves no 17. Recurrent pregnancy loss, or stillbirth  no  18. Any medications since LMP other than prenatal vitamins (include vitamins, supplements, OTC meds, drugs, alcohol)  no 19. Any other genetic/environmental exposure to discuss  no  Infection History:   1. Lives with someone with TB or TB exposed  no  2. Patient or partner has history of genital herpes  no 3. Rash or viral illness since LMP  no 4. History of STI (GC, CT, HPV, syphilis, HIV)  no 5. History of recent travel :  no  Other pertinent information:  no     Review of Systems:10 point review of systems negative unless otherwise noted in HPI  Past Medical History:  Past Medical History:  Diagnosis Date  . Preeclampsia in postpartum period     Past Surgical History:  History reviewed. No pertinent surgical history.  Gynecologic History: Patient's last menstrual period was 04/02/2016.  Obstetric History: [id number]  Family History:  Family History  Problem Relation Age of Onset  . Colon cancer Maternal Uncle   . Lung cancer Maternal Grandfather   . Diabetes Paternal Grandmother     Social History:  Social History   Social History  .  Marital status: Married    Spouse name: N/A  . Number of children: N/A  . Years of education: N/A   Occupational History  . Not on file.   Social History Main Topics  . Smoking status: Never Smoker  . Smokeless tobacco: Never Used  . Alcohol use No  . Drug use: No  . Sexual activity: Yes    Birth control/ protection: None   Other Topics Concern  . Not on file   Social History Narrative  . No narrative on file    Allergies:  Allergies  Allergen Reactions  . Penicillins Rash    Medications: Prior to Admission medications   Medication Sig Start  Date End Date Taking? Authorizing Provider  Prenatal Vit-Fe Fumarate-FA (PRENATAL VITAMIN PO) Take by mouth.   Yes [provider]    Physical Exam Vitals: Blood pressure 114/70, height 5\' 6"  (1.676 m), weight 131 lb (59.4 kg), last menstrual period 04/02/2016.  General: NAD HEENT: normocephalic, anicteric Thyroid: no enlargement, no palpable nodules Pulmonary: No increased work of breathing, CTAB Cardiovascular: RRR, distal pulses 2+ Abdomen: NABS, soft, non-tender, non-distended.  Umbilicus without lesions.  No hepatomegaly, splenomegaly or masses palpable. No evidence of hernia  Genitourinary:  External: Normal external female genitalia.  Normal urethral meatus, normal  Bartholin's and Skene's glands.    Vagina: Normal vaginal mucosa, no evidence of prolapse.    Cervix: Grossly normal in appearance, no bleeding, no CMT  Uterus: Enlarged, mobile, normal contour.    Adnexa: ovaries non-enlarged, no adnexal masses  Rectal: deferred Extremities: no edema, erythema, or tenderness Neurologic: Grossly intact Psychiatric: mood appropriate, affect full   Assessment: 31 y.o. [id number] at 5166w6d presenting to initiate prenatal care  Plan: 1) Avoid alcoholic beverages. 2) Patient encouraged not to smoke.  3) Discontinue the use of all non-medicinal drugs and chemicals.  4) Take prenatal vitamins daily.  5) Nutrition, food safety (fish, cheese advisories, and high nitrite foods) and exercise discussed. 6) Hospital and practice style discussed with cross coverage system.  7) Genetic Screening, such as with 1st Trimester Screening, cell free fetal DNA, AFP testing, and Ultrasound, as well as with amniocentesis and CVS as appropriate, is discussed with patient. At the conclusion of today's visit patient requested genetic testing 8) Patient is asked about travel to areas at risk for the Zika virus, and counseled to avoid travel and exposure to mosquitoes or sexual partners who may have  themselves been exposed to the virus. Testing is discussed, and will be ordered as appropriate.    Tresea MallJane Jakera Beaupre, CNM

## 2016-06-05 LAB — RPR+RH+ABO+RUB AB+AB SCR+CB...
ANTIBODY SCREEN: NEGATIVE
HEP B S AG: NEGATIVE
HIV SCREEN 4TH GENERATION: NONREACTIVE
Hematocrit: 35.8 % (ref 34.0–46.6)
Hemoglobin: 12.4 g/dL (ref 11.1–15.9)
MCH: 30.6 pg (ref 26.6–33.0)
MCHC: 34.6 g/dL (ref 31.5–35.7)
MCV: 88 fL (ref 79–97)
PLATELETS: 160 10*3/uL (ref 150–379)
RBC: 4.05 x10E6/uL (ref 3.77–5.28)
RDW: 12.4 % (ref 12.3–15.4)
RPR: NONREACTIVE
Rh Factor: POSITIVE
Rubella Antibodies, IGG: 5.46 index (ref >0.99)
Varicella zoster IgG: 858 index (ref >165)
WBC: 7.1 10*3/uL (ref 3.4–10.8)

## 2016-06-05 LAB — GC/CHLAMYDIA PROBE AMP
CHLAMYDIA, DNA PROBE: NEGATIVE
NEISSERIA GONORRHOEAE BY PCR: NEGATIVE

## 2016-06-05 LAB — URINE CULTURE: ORGANISM ID, BACTERIA: NO GROWTH

## 2016-06-09 ENCOUNTER — Ambulatory Visit (INDEPENDENT_AMBULATORY_CARE_PROVIDER_SITE_OTHER): Payer: 59

## 2016-06-09 DIAGNOSIS — Z362 Encounter for other antenatal screening follow-up: Secondary | ICD-10-CM

## 2016-06-09 DIAGNOSIS — Z3490 Encounter for supervision of normal pregnancy, unspecified, unspecified trimester: Secondary | ICD-10-CM

## 2016-06-09 DIAGNOSIS — Z3480 Encounter for supervision of other normal pregnancy, unspecified trimester: Secondary | ICD-10-CM

## 2016-06-10 ENCOUNTER — Ambulatory Visit (INDEPENDENT_AMBULATORY_CARE_PROVIDER_SITE_OTHER): Payer: 59 | Admitting: Advanced Practice Midwife

## 2016-06-10 VITALS — BP 110/60 | Wt 129.0 lb

## 2016-06-10 DIAGNOSIS — Z3A09 9 weeks gestation of pregnancy: Secondary | ICD-10-CM

## 2016-06-10 DIAGNOSIS — Z8759 Personal history of other complications of pregnancy, childbirth and the puerperium: Secondary | ICD-10-CM

## 2016-06-10 DIAGNOSIS — O0990 Supervision of high risk pregnancy, unspecified, unspecified trimester: Secondary | ICD-10-CM | POA: Insufficient documentation

## 2016-06-10 DIAGNOSIS — Z1379 Encounter for other screening for genetic and chromosomal anomalies: Secondary | ICD-10-CM

## 2016-06-10 DIAGNOSIS — Z3682 Encounter for antenatal screening for nuchal translucency: Secondary | ICD-10-CM

## 2016-06-10 DIAGNOSIS — Z3480 Encounter for supervision of other normal pregnancy, unspecified trimester: Secondary | ICD-10-CM

## 2016-06-10 NOTE — Progress Notes (Signed)
Doing well. Dating scan = dates. Return in 4 weeks for 1st trimester screen.

## 2016-06-10 NOTE — Addendum Note (Signed)
Addended by: Tresea MallGLEDHILL, Dahlton Hinde on: 06/10/2016 08:38 AM   Modules accepted: Orders

## 2016-07-08 ENCOUNTER — Ambulatory Visit (INDEPENDENT_AMBULATORY_CARE_PROVIDER_SITE_OTHER): Payer: 59 | Admitting: Advanced Practice Midwife

## 2016-07-08 ENCOUNTER — Other Ambulatory Visit (INDEPENDENT_AMBULATORY_CARE_PROVIDER_SITE_OTHER): Payer: 59

## 2016-07-08 VITALS — BP 108/60 | Wt 132.0 lb

## 2016-07-08 DIAGNOSIS — Z1379 Encounter for other screening for genetic and chromosomal anomalies: Secondary | ICD-10-CM

## 2016-07-08 DIAGNOSIS — Z3480 Encounter for supervision of other normal pregnancy, unspecified trimester: Secondary | ICD-10-CM

## 2016-07-08 DIAGNOSIS — Z3682 Encounter for antenatal screening for nuchal translucency: Secondary | ICD-10-CM

## 2016-07-08 DIAGNOSIS — Z3A13 13 weeks gestation of pregnancy: Secondary | ICD-10-CM

## 2016-07-08 NOTE — Progress Notes (Signed)
No vb. No lof. NT screen today. 

## 2016-07-08 NOTE — Progress Notes (Signed)
Doing well. Nausea has improved. 1st trimester screen today. RTC in 4 wks for ROB.

## 2016-07-13 LAB — FIRST TRIMESTER SCREEN W/NT
CRL: 80.3 mm
DIA MoM: 0.49
DIA VALUE: 116.2 pg/mL
Gest Age-Collect: 13.3 weeks
HCG MOM: 0.96
HCG VALUE: 80.6 [IU]/mL
Maternal Age At EDD: 32.4 yr
NUCHAL TRANSLUCENCY MOM: 1.07
NUMBER OF FETUSES: 1
Nuchal Translucency: 2.1 mm
PAPP-A MOM: 1.11
PAPP-A Value: 1615.4 ng/mL
TEST RESULTS: NEGATIVE
Weight: 132 [lb_av]

## 2016-08-03 ENCOUNTER — Ambulatory Visit (INDEPENDENT_AMBULATORY_CARE_PROVIDER_SITE_OTHER): Payer: 59 | Admitting: Obstetrics and Gynecology

## 2016-08-03 VITALS — BP 102/56 | Wt 140.0 lb

## 2016-08-03 DIAGNOSIS — Z3A17 17 weeks gestation of pregnancy: Secondary | ICD-10-CM

## 2016-08-03 DIAGNOSIS — Z363 Encounter for antenatal screening for malformations: Secondary | ICD-10-CM

## 2016-08-03 DIAGNOSIS — Z3480 Encounter for supervision of other normal pregnancy, unspecified trimester: Secondary | ICD-10-CM

## 2016-08-03 DIAGNOSIS — Z8759 Personal history of other complications of pregnancy, childbirth and the puerperium: Secondary | ICD-10-CM

## 2016-08-03 NOTE — Progress Notes (Signed)
ROB No concerns 

## 2016-08-03 NOTE — Progress Notes (Signed)
Anatomy scan next visit.  Following 25 week IUFD complicated by cystic hygroma and hydropic changes DP recommended monthly growth scans to evaluate for hydrops in following pregnancy.

## 2016-08-18 ENCOUNTER — Ambulatory Visit (INDEPENDENT_AMBULATORY_CARE_PROVIDER_SITE_OTHER): Payer: 59

## 2016-08-18 ENCOUNTER — Ambulatory Visit (INDEPENDENT_AMBULATORY_CARE_PROVIDER_SITE_OTHER): Payer: 59 | Admitting: Obstetrics and Gynecology

## 2016-08-18 VITALS — BP 110/68 | Wt 142.0 lb

## 2016-08-18 DIAGNOSIS — Z3A17 17 weeks gestation of pregnancy: Secondary | ICD-10-CM

## 2016-08-18 DIAGNOSIS — O09292 Supervision of pregnancy with other poor reproductive or obstetric history, second trimester: Secondary | ICD-10-CM

## 2016-08-18 DIAGNOSIS — Z363 Encounter for antenatal screening for malformations: Secondary | ICD-10-CM | POA: Diagnosis not present

## 2016-08-18 DIAGNOSIS — Z8759 Personal history of other complications of pregnancy, childbirth and the puerperium: Secondary | ICD-10-CM | POA: Diagnosis not present

## 2016-08-18 DIAGNOSIS — Z3480 Encounter for supervision of other normal pregnancy, unspecified trimester: Secondary | ICD-10-CM

## 2016-08-18 DIAGNOSIS — Z3A19 19 weeks gestation of pregnancy: Secondary | ICD-10-CM

## 2016-08-18 DIAGNOSIS — O0990 Supervision of high risk pregnancy, unspecified, unspecified trimester: Secondary | ICD-10-CM

## 2016-08-18 DIAGNOSIS — O09293 Supervision of pregnancy with other poor reproductive or obstetric history, third trimester: Secondary | ICD-10-CM | POA: Insufficient documentation

## 2016-08-18 NOTE — Progress Notes (Signed)
  Routine Prenatal Care Visit  Subjective  Elizabeth Kramer is a 31 y.o. [id number] at 4210w5d being seen today for ongoing prenatal care.  She is currently monitored for the following issues for this high-risk pregnancy and has History of pregnancy induced hypertension; Supervision of high risk pregnancy, antepartum; and Hx of preeclampsia, prior pregnancy, currently pregnant, second trimester on her problem list.  ----------------------------------------------------------------------------------- Patient reports no complaints.   Contractions: Not present. Vag. Bleeding: None.  Movement: Present. Denies leaking of fluid.  Anatomy scan incomplete for nose/lips.  ----------------------------------------------------------------------------------- The following portions of the patient's history were reviewed and updated as appropriate: allergies, current medications, past family history, past medical history, past social history, past surgical history and problem list. Problem list updated.  Objective  Blood pressure 110/68, weight 142 lb (64.4 kg), last menstrual period 04/02/2016. Pregravid weight 125 lb (56.7 kg) Total Weight Gain 17 lb (7.711 kg) Urinalysis: Urine Protein: Negative Urine Glucose: Negative Fetal Status:     Movement: Present     General:  Alert, oriented and cooperative. Patient is in no acute distress.  Skin: Skin is warm and dry. No rash noted.   Cardiovascular: Normal heart rate noted  Respiratory: Normal respiratory effort, no problems with respiration noted  Abdomen: Soft, gravid, appropriate for gestational age. Pain/Pressure: Absent     Pelvic:  Cervical exam deferred        Extremities: Normal range of motion.     Mental Status: Normal mood and affect. Normal behavior. Normal judgment and thought content.   Assessment  31 y.o. [id number] at 4410w5d by  01/07/2017, by Last Menstrual Period presenting for routine prenatal visit  Clinic [facility] Prenatal Labs  Dating  Blood  type: A/Positive/-- (06/01 1048)   Genetic Screen 1 Screen:    AFP:     Quad:     NIPS: Antibody:Negative (06/01 1048)  Anatomic US  Rubella: 5.46 (06/01 1048) Varicella:    GTT Early:               Third trimester:  RPR: Non Reactive (06/01 1048)   Rhogam  HBsAg: Negative (06/01 1048)   TDaP vaccine                       Flu Shot: HIV:     Baby Food                                GBS:   Contraception  Pap:  CBB     CS/VBAC    Support Person         Plan   pregnancy Problems (from 06/03/16 to present)    Problem Noted Resolved   Hx of preeclampsia, prior pregnancy, currently pregnant, second trimester 08/18/2016 by Conard NovakJackson, Stephen D, MD No   Supervision of high risk pregnancy, antepartum 06/10/2016 by Tresea MallGledhill, Jane, CNM No      Preterm labor symptoms and general obstetric precautions including but not limited to vaginal bleeding, contractions, leaking of fluid and fetal movement were reviewed in detail with the patient. Please refer to After Visit Summary for other counseling recommendations.   Return in about 4 weeks (around 09/15/2016) for Routine Prenatal Appointment.  Thomasene MohairStephen Jackson, MD  08/18/2016 4:41 PM

## 2016-08-19 ENCOUNTER — Other Ambulatory Visit: Payer: 59

## 2016-08-19 ENCOUNTER — Encounter: Payer: 59 | Admitting: Obstetrics & Gynecology

## 2016-09-15 ENCOUNTER — Ambulatory Visit (INDEPENDENT_AMBULATORY_CARE_PROVIDER_SITE_OTHER): Payer: 59 | Admitting: Maternal Newborn

## 2016-09-15 ENCOUNTER — Ambulatory Visit (INDEPENDENT_AMBULATORY_CARE_PROVIDER_SITE_OTHER): Payer: 59

## 2016-09-15 VITALS — BP 98/58 | Wt 146.0 lb

## 2016-09-15 DIAGNOSIS — Z362 Encounter for other antenatal screening follow-up: Secondary | ICD-10-CM | POA: Diagnosis not present

## 2016-09-15 DIAGNOSIS — Z3A23 23 weeks gestation of pregnancy: Secondary | ICD-10-CM

## 2016-09-15 DIAGNOSIS — O0990 Supervision of high risk pregnancy, unspecified, unspecified trimester: Secondary | ICD-10-CM | POA: Diagnosis not present

## 2016-09-15 DIAGNOSIS — Z8759 Personal history of other complications of pregnancy, childbirth and the puerperium: Secondary | ICD-10-CM

## 2016-09-15 DIAGNOSIS — O09292 Supervision of pregnancy with other poor reproductive or obstetric history, second trimester: Secondary | ICD-10-CM

## 2016-09-15 NOTE — Progress Notes (Addendum)
    Routine Prenatal Care Visit  Subjective  Maryelizabeth RowanJennifer Knoth is a 31 y.o. [id number] at 8330w5d being seen today for ongoing prenatal care.  She is currently monitored for the following issues for this high-risk pregnancy and has History of pregnancy induced hypertension; Supervision of high risk pregnancy, antepartum; and Hx of preeclampsia, prior pregnancy, currently pregnant, second trimester on her problem list.  ----------------------------------------------------------------------------------- Patient reports no complaints.   Contractions: Not present. Vag. Bleeding: None.  Movement: Present. Denies leaking of fluid.  ----------------------------------------------------------------------------------- The following portions of the patient's history were reviewed and updated as appropriate: allergies, current medications, past family history, past medical history, past social history, past surgical history and problem list. Problem list updated.   Objective  Blood pressure (!) 98/58, weight 146 lb (66.2 kg), last menstrual period 04/02/2016. Pregravid weight 125 lb (56.7 kg) Total Weight Gain 21 lb (9.526 kg) Urinalysis: Urine Protein: Negative Urine Glucose: Negative  Fetal Status: Fetal Heart Rate (bpm): 141   Movement: Present     General:  Alert, oriented and cooperative. Patient is in no acute distress.  Skin: Skin is warm and dry. No rash noted.   Cardiovascular: Normal heart rate noted  Respiratory: Normal respiratory effort, no problems with respiration noted  Abdomen: Soft, gravid, appropriate for gestational age. Pain/Pressure: Absent     Pelvic:  Cervical exam deferred        Extremities: Normal range of motion.     Mental Status: Normal mood and affect. Normal behavior. Normal judgment and thought content.     Assessment   31 y.o. [id number] at 4730w5d by  01/07/2017, by Last Menstrual Period presenting for routine prenatal visit  Plan   pregnancy Problems (from 06/03/16 to  present)    Problem Noted Resolved   Hx of preeclampsia, prior pregnancy, currently pregnant, second trimester 08/18/2016 by Conard NovakJackson, Stephen D, MD No   Supervision of high risk pregnancy, antepartum 06/10/2016 by Tresea MallGledhill, Jane, CNM No       Preterm labor symptoms and general obstetric precautions including but not limited to vaginal bleeding, contractions, leaking of fluid and fetal movement were reviewed in detail with the patient.  Baseline labs ordered with 28 week labs for next visit.  Return in about 4 weeks (around 10/13/2016) for ROB and glucola/labs.  Marcelyn BruinsJacelyn Schmid, CNM 09/15/2016  11:14 AM

## 2016-09-16 ENCOUNTER — Other Ambulatory Visit: Payer: 59

## 2016-09-16 ENCOUNTER — Encounter: Payer: 59 | Admitting: Obstetrics and Gynecology

## 2016-10-14 ENCOUNTER — Other Ambulatory Visit: Payer: 59

## 2016-10-14 ENCOUNTER — Ambulatory Visit (INDEPENDENT_AMBULATORY_CARE_PROVIDER_SITE_OTHER): Payer: 59 | Admitting: Obstetrics and Gynecology

## 2016-10-14 DIAGNOSIS — Z23 Encounter for immunization: Secondary | ICD-10-CM

## 2016-10-14 DIAGNOSIS — O0990 Supervision of high risk pregnancy, unspecified, unspecified trimester: Secondary | ICD-10-CM

## 2016-10-14 DIAGNOSIS — Z8759 Personal history of other complications of pregnancy, childbirth and the puerperium: Secondary | ICD-10-CM | POA: Insufficient documentation

## 2016-10-14 DIAGNOSIS — O09292 Supervision of pregnancy with other poor reproductive or obstetric history, second trimester: Secondary | ICD-10-CM

## 2016-10-14 NOTE — Progress Notes (Signed)
Routine Prenatal Care Visit  Subjective  Elizabeth Kramer is a 31 y.o. [id number] at [redacted]w[redacted]d being seen today for ongoing prenatal care.  She is currently monitored for the following issues for this high-risk pregnancy and has Supervision of high risk pregnancy, antepartum; Hx of preeclampsia, prior pregnancy, currently pregnant, second trimester; and History of IUFD on her problem list.  ----------------------------------------------------------------------------------- Patient reports no complaints.   Contractions: Not present. Vag. Bleeding: None.  Movement: Present. Denies leaking of fluid.  ----------------------------------------------------------------------------------- The following portions of the patient's history were reviewed and updated as appropriate: allergies, current medications, past family history, past medical history, past social history, past surgical history and problem list. Problem list updated.   Objective  Blood pressure (!) 100/48, weight 153 lb (69.4 kg), last menstrual period 04/02/2016. Pregravid weight 125 lb (56.7 kg) Total Weight Gain 28 lb (12.7 kg) Urinalysis: Urine Protein: Negative Urine Glucose: Negative  Fetal Status: Fetal Heart Rate (bpm): 145   Movement: Present     General:  Alert, oriented and cooperative. Patient is in no acute distress.  Skin: Skin is warm and dry. No rash noted.   Cardiovascular: Normal heart rate noted  Respiratory: Normal respiratory effort, no problems with respiration noted  Abdomen: Soft, gravid, appropriate for gestational age. Pain/Pressure: Absent     Pelvic:  Cervical exam deferred        Extremities: Normal range of motion.     ental Status: Normal mood and affect. Normal behavior. Normal judgment and thought content.     Assessment   31 y.o. [id number] at [redacted]w[redacted]d by  01/07/2017, by Last Menstrual Period presenting for routine prenatal visit  Plan   pregnancy Problems (from 06/03/16 to present)    Problem Noted  Resolved   History of IUFD 10/14/2016 by Vena Austria, MD No   Overview Signed 10/14/2016  8:51 AM by Vena Austria, MD    q4 week growth scans      Hx of preeclampsia, prior pregnancy, currently pregnant, second trimester 08/18/2016 by Conard Novak, MD No   Overview Addendum 10/14/2016  8:48 AM by Vena Austria, MD     Aspirin 81 mg daily after 12 weeks; discontinue after 36 weeks  Patient with postpartum onset preeclampsia in prior pregnancies will need 1 week postpartum BP check  Baseline and surveillance labs (pulled in from [facility], refresh links as needed)  Lab Results  Component Value Date   PLT 160 06/03/2016   CREATININE 0.49 (L) 10/16/2013   AST 31 10/16/2013   ALT 28 10/16/2013   LABPROT 12.2 11/04/2012         Supervision of high risk pregnancy, antepartum 06/10/2016 by Tresea Mall, CNM No   Overview Addendum 10/14/2016  9:02 AM by Vena Austria, MD    [facility]inic [facility] Prenatal Labs  Dating LMP = 9 week [place] Blood type: A/Positive/-- (06/01 1048)   Genetic Screen 1 Screen: negative Antibody:Negative (06/01 1048)  Anatomic [place] Normal female Rubella: Immune Varicella: Immune  GTT Third trimester:  RPR: Non Reactive (06/01 1048)   Rhogam N/A HBsAg: Negative (06/01 1048)   TDaP vaccine 30 weeks Flu Shot: 10/14/16 HIV: Negative  Baby Food                                GBS:   Contraception  Pap: 03/04/2015 NIL HPV negative  CBB     CS/VBAC    Support Person  Preterm labor symptoms and general obstetric precautions including but not limited to vaginal bleeding, contractions, leaking of fluid and fetal movement were reviewed in detail with the patient. Please refer to After Visit Summary for other counseling recommendations.  - 28 week labs today - influenza vaccination - start q4week growth scan ordered for 30 weeks  Return in about 2 weeks (around 10/28/2016) for ROB and growth scan.

## 2016-10-14 NOTE — Progress Notes (Signed)
Elizabeth Kramer 28 week labs toda FLU vaccine given

## 2016-10-16 ENCOUNTER — Emergency Department: Admission: EM | Admit: 2016-10-16 | Discharge: 2016-10-17 | Payer: 59

## 2016-10-16 ENCOUNTER — Telehealth: Payer: Self-pay | Admitting: Obstetrics and Gynecology

## 2016-10-16 LAB — COMPREHENSIVE METABOLIC PANEL
ALT: 12 IU/L (ref 0–32)
AST: 15 IU/L (ref 0–40)
Albumin/Globulin Ratio: 1.4 (ref 1.2–2.2)
Albumin: 3.4 g/dL — ABNORMAL LOW (ref 3.5–5.5)
Alkaline Phosphatase: 72 IU/L (ref 39–117)
BILIRUBIN TOTAL: 0.2 mg/dL (ref 0.0–1.2)
BUN / CREAT RATIO: 17 (ref 9–23)
BUN: 9 mg/dL (ref 6–20)
CALCIUM: 8.2 mg/dL — AB (ref 8.7–10.2)
CHLORIDE: 105 mmol/L (ref 96–106)
CO2: 20 mmol/L (ref 20–29)
CREATININE: 0.52 mg/dL — AB (ref 0.57–1.00)
GFR calc non Af Amer: 127 mL/min/{1.73_m2} (ref >59)
GFR, EST AFRICAN AMERICAN: 146 mL/min/{1.73_m2} (ref >59)
GLUCOSE: 60 mg/dL — AB (ref 65–99)
Globulin, Total: 2.4 g/dL (ref 1.5–4.5)
POTASSIUM: 3.7 mmol/L (ref 3.5–5.2)
Sodium: 139 mmol/L (ref 134–144)
TOTAL PROTEIN: 5.8 g/dL — AB (ref 6.0–8.5)

## 2016-10-16 LAB — 28 WEEK RH+PANEL
BASOS ABS: 0 10*3/uL (ref 0.0–0.2)
Basos: 0 %
EOS (ABSOLUTE): 0.1 10*3/uL (ref 0.0–0.4)
Eos: 1 %
Gestational Diabetes Screen: 65 mg/dL (ref 65–139)
HEMOGLOBIN: 11.1 g/dL (ref 11.1–15.9)
HIV Screen 4th Generation wRfx: NONREACTIVE
Hematocrit: 33.8 % — ABNORMAL LOW (ref 34.0–46.6)
IMMATURE GRANS (ABS): 0 10*3/uL (ref 0.0–0.1)
IMMATURE GRANULOCYTES: 0 %
LYMPHS: 14 %
Lymphocytes Absolute: 1.3 10*3/uL (ref 0.7–3.1)
MCH: 31 pg (ref 26.6–33.0)
MCHC: 32.8 g/dL (ref 31.5–35.7)
MCV: 94 fL (ref 79–97)
MONOS ABS: 0.5 10*3/uL (ref 0.1–0.9)
Monocytes: 5 %
Neutrophils Absolute: 7.9 10*3/uL — ABNORMAL HIGH (ref 1.4–7.0)
Neutrophils: 80 %
PLATELETS: 153 10*3/uL (ref 150–379)
RBC: 3.58 x10E6/uL — ABNORMAL LOW (ref 3.77–5.28)
RDW: 13 % (ref 12.3–15.4)
RPR Ser Ql: NONREACTIVE
WBC: 9.8 10*3/uL (ref 3.4–10.8)

## 2016-10-16 NOTE — Telephone Encounter (Signed)
Patient reports a fall where she landed on her bottom (no abdominal trauma) where she fell down two stairs. She felt extra fluid in her underwear.  She was briefly evaluated on L&D where she had a reactive tracing: Baseline FHR: 140 beats/min Variability: moderate Accelerations: present Decelerations: absent Tocometry: no activity  Interpretation:  INDICATIONS: patient reassurance RESULTS:  A NST procedure was performed with FHR monitoring and a normal baseline established, appropriate time of 20-40 minutes of evaluation, and accels >2 seen w 10x10 characteristics.  Results show a REACTIVE NST.    After her fall she had no further excess fluid. Nitrazine paper testing of the fluid was negative.   She was reassured regarding her fall. She was instructed to return to L&D ASAP should she have any further concern regarding leaking fluid (she had none at the time of our conversation), decreased fetal movement, vaginal bleeding, contractions, or any other concerning symptoms.

## 2016-10-28 ENCOUNTER — Ambulatory Visit (INDEPENDENT_AMBULATORY_CARE_PROVIDER_SITE_OTHER): Payer: 59 | Admitting: Obstetrics and Gynecology

## 2016-10-28 ENCOUNTER — Ambulatory Visit (INDEPENDENT_AMBULATORY_CARE_PROVIDER_SITE_OTHER): Payer: 59

## 2016-10-28 VITALS — BP 98/62 | Wt 156.0 lb

## 2016-10-28 DIAGNOSIS — O09292 Supervision of pregnancy with other poor reproductive or obstetric history, second trimester: Secondary | ICD-10-CM

## 2016-10-28 DIAGNOSIS — Z3A29 29 weeks gestation of pregnancy: Secondary | ICD-10-CM | POA: Diagnosis not present

## 2016-10-28 DIAGNOSIS — Z8759 Personal history of other complications of pregnancy, childbirth and the puerperium: Secondary | ICD-10-CM

## 2016-10-28 DIAGNOSIS — Z23 Encounter for immunization: Secondary | ICD-10-CM

## 2016-10-28 DIAGNOSIS — O0990 Supervision of high risk pregnancy, unspecified, unspecified trimester: Secondary | ICD-10-CM | POA: Diagnosis not present

## 2016-10-28 NOTE — Progress Notes (Signed)
ROB Growth U/S  Pt has no concerns  TDAP and Blood consent

## 2016-10-28 NOTE — Progress Notes (Signed)
Routine Prenatal Care Visit  Subjective  Elizabeth Kramer is a 31 y.o. [id number] at [redacted]w[redacted]d being seen today for ongoing prenatal care.  She is currently monitored for the following issues for this high-risk pregnancy and has Supervision of high risk pregnancy, antepartum; Hx of preeclampsia, prior pregnancy, currently pregnant, second trimester; and History of IUFD on her problem list.  ----------------------------------------------------------------------------------- Patient reports no complaints.   Contractions: Not present. Vag. Bleeding: None.  Movement: Present. Denies leaking of fluid.  ----------------------------------------------------------------------------------- The following portions of the patient's history were reviewed and updated as appropriate: allergies, current medications, past family history, past medical history, past social history, past surgical history and problem list. Problem list updated.   Objective  Blood pressure 98/62, weight 156 lb (70.8 kg), last menstrual period 04/02/2016. Pregravid weight 125 lb (56.7 kg) Total Weight Gain 31 lb (14.1 kg) Urinalysis: Urine Protein: Negative Urine Glucose: Negative  Fetal Status: Fetal Heart Rate (bpm): 140 Fundal Height: 29 cm Movement: Present     General:  Alert, oriented and cooperative. Patient is in no acute distress.  Skin: Skin is warm and dry. No rash noted.   Cardiovascular: Normal heart rate noted  Respiratory: Normal respiratory effort, no problems with respiration noted  Abdomen: Soft, gravid, appropriate for gestational age. Pain/Pressure: Absent     Pelvic:  Cervical exam deferred        Extremities: Normal range of motion.     ental Status: Normal mood and affect. Normal behavior. Normal judgment and thought content.     Assessment   32 y.o. [id number] at [redacted]w[redacted]d by  01/07/2017, by Last Menstrual Period presenting for routine prenatal visit  Plan   pregnancy Problems (from 06/03/16 to present)    Problem Noted Resolved   History of IUFD 10/14/2016 by Vena Austria, MD No   Overview Signed 10/14/2016  8:51 AM by Vena Austria, MD    q4 week growth scans      Hx of preeclampsia, prior pregnancy, currently pregnant, second trimester 08/18/2016 by Conard Novak, MD No   Overview Addendum 10/14/2016  8:48 AM by Vena Austria, MD    [X]  Aspirin 81 mg daily after 12 weeks; discontinue after 36 weeks  Patient with postpartum onset preeclampsia in prior pregnancies will need 1 week postpartum BP check  Baseline and surveillance labs (pulled in from [facility], refresh links as needed)  Lab Results  Component Value Date   PLT 160 06/03/2016   CREATININE 0.49 (L) 10/16/2013   AST 31 10/16/2013   ALT 28 10/16/2013   LABPROT 12.2 11/04/2012         Supervision of high risk pregnancy, antepartum 06/10/2016 by Tresea Mall, CNM No   Overview Addendum 10/28/2016  9:48 PM by Vena Austria, MD    [facility]inic [facility] Prenatal Labs  Dating LMP = 9 week [place] Blood type: A/Positive/-- (06/01 1048)   Genetic Screen 1 Screen: negative Antibody:Negative (06/01 1048)  Anatomic [place] Normal female Rubella: Immune Varicella: Immune  GTT Third trimester:  RPR: Non Reactive (06/01 1048)   Rhogam N/A HBsAg: Negative (06/01 1048)   TDaP vaccine 10/28/16 Flu Shot: 10/14/16 HIV: Negative  Baby Food                                GBS:   Contraception  Pap: 03/04/2015 NIL HPV negative  CBB     CS/VBAC    Support Person  Preterm labor symptoms and general obstetric precautions including but not limited to vaginal bleeding, contractions, leaking of fluid and fetal movement were reviewed in detail with the patient. Please refer to After Visit Summary for other counseling recommendations.   - growth scan today 3lbs 8oz c/w 60%ile AFI of 17cm  Return in about 2 weeks (around 11/11/2016) for 2 week ROB, 4 weeks ROB and growth scan.

## 2016-10-31 ENCOUNTER — Telehealth: Payer: Self-pay

## 2016-10-31 NOTE — Telephone Encounter (Signed)
Upon arriving to office this morning there was a [facility] regarding this [facility] [place] [facility]  Her number is [phone] ext. [phone].  I tried to return her call, but she was not at her desk.  I LMVM with my direct line and advised her to call me back at her convenience.

## 2016-11-11 ENCOUNTER — Ambulatory Visit (INDEPENDENT_AMBULATORY_CARE_PROVIDER_SITE_OTHER): Payer: 59 | Admitting: Obstetrics and Gynecology

## 2016-11-11 ENCOUNTER — Encounter: Payer: Self-pay | Admitting: Obstetrics and Gynecology

## 2016-11-11 VITALS — BP 100/64 | Wt 160.0 lb

## 2016-11-11 DIAGNOSIS — Z8759 Personal history of other complications of pregnancy, childbirth and the puerperium: Secondary | ICD-10-CM

## 2016-11-11 DIAGNOSIS — O09293 Supervision of pregnancy with other poor reproductive or obstetric history, third trimester: Secondary | ICD-10-CM

## 2016-11-11 DIAGNOSIS — O0990 Supervision of high risk pregnancy, unspecified, unspecified trimester: Secondary | ICD-10-CM

## 2016-11-11 DIAGNOSIS — Z3A31 31 weeks gestation of pregnancy: Secondary | ICD-10-CM

## 2016-11-11 NOTE — Patient Instructions (Signed)

## 2016-11-11 NOTE — Progress Notes (Signed)
Routine Prenatal Care Visit  Subjective  Elizabeth Kramer is a 31 y.o. [id number] at 6655w6d being seen today for ongoing prenatal care.  She is currently monitored for the following issues for this high-risk pregnancy and has Supervision of high risk pregnancy, antepartum; Hx of preeclampsia, prior pregnancy, currently pregnant, third trimester; and History of IUFD on their problem list.  ----------------------------------------------------------------------------------- Patient reports no complaints.   Contractions: Not present. Vag. Bleeding: None.  Movement: Present. Denies leaking of fluid.  ----------------------------------------------------------------------------------- The following portions of the patient's history were reviewed and updated as appropriate: allergies, current medications, past family history, past medical history, past social history, past surgical history and problem list. Problem list updated.   Objective  Blood pressure 100/64, weight 160 lb (72.6 kg), last menstrual period 04/02/2016. Pregravid weight 125 lb (56.7 kg) Total Weight Gain 35 lb (15.9 kg) Urinalysis: Urine Protein: Negative Urine Glucose: Negative  Fetal Status: Fetal Heart Rate (bpm): 135 Fundal Height: 31 cm Movement: Present     General:  Alert, oriented and cooperative. Patient is in no acute distress.  Skin: Skin is warm and dry. No rash noted.   Cardiovascular: Normal heart rate noted  Respiratory: Normal respiratory effort, no problems with respiration noted  Abdomen: Soft, gravid, appropriate for gestational age. Pain/Pressure: Absent     Pelvic:  Cervical exam deferred        Extremities: Normal range of motion.     Mental Status: Normal mood and affect. Normal behavior. Normal judgment and thought content.   Assessment   31 y.o. [id number] at 1455w6d by  01/07/2017, by Last Menstrual Period presenting for routine prenatal visit  Plan   pregnancy Problems (from 06/03/16 to present)    Problem Noted Resolved   History of IUFD 10/14/2016 by Vena AustriaStaebler, Andreas, MD No   Overview Signed 10/14/2016  8:51 AM by Vena AustriaStaebler, Andreas, MD    q4 week growth scans      Hx of preeclampsia, prior pregnancy, currently pregnant, third trimester 08/18/2016 by Conard NovakJackson, Stephen D, MD No   Overview Addendum 10/14/2016  8:48 AM by Vena AustriaStaebler, Andreas, MD    [X]  Aspirin 81 mg daily after 12 weeks; discontinue after 36 weeks  Patient with postpartum onset preeclampsia in prior pregnancies will need 1 week postpartum BP check  [facility] refresh links as needed)  Lab Results  Component Value Date   PLT 160 06/03/2016   CREATININE 0.49 (L) 10/16/2013   AST 31 10/16/2013   ALT 28 10/16/2013   LABPROT 12.2 11/04/2012         Supervision of high risk pregnancy, antepartum 06/10/2016 by Tresea MallGledhill, Jane, CNM No   Overview Addendum 10/28/2016  9:48 PM by Vena AustriaStaebler, Andreas, MD    [facility]inic [facility] Prenatal Labs  Dating LMP = 9 week US Blood type: A/Positive/-- (06/01 1048)   Genetic Screen 1 Screen: negative Antibody:Negative (06/01 1048)  Anatomic US Normal female Rubella: Immune Varicella: Immune  GTT Third trimester:  RPR: Non Reactive (06/01 1048)   Rhogam N/A HBsAg: Negative (06/01 1048)   TDaP vaccine 10/28/16 Flu Shot: 10/14/16 HIV: Negative  Baby Food                                GBS:   Contraception  Pap: 03/04/2015 NIL HPV negative  CBB     CS/VBAC    Support Person  Preterm labor symptoms and general obstetric precautions including but not limited to vaginal bleeding, contractions, leaking of fluid and fetal movement were reviewed in detail with the patient. Please refer to After Visit Summary for other counseling recommendations.   Return in about 2 weeks (around 11/25/2016) for keep previous appt with growth u/s and routine prenatal after with Dr Bonney AidStaebler.  Thomasene MohairStephen Jackson, MD  11/11/2016 9:18 AM

## 2016-11-28 ENCOUNTER — Ambulatory Visit (INDEPENDENT_AMBULATORY_CARE_PROVIDER_SITE_OTHER): Payer: 59 | Admitting: Obstetrics and Gynecology

## 2016-11-28 ENCOUNTER — Ambulatory Visit (INDEPENDENT_AMBULATORY_CARE_PROVIDER_SITE_OTHER): Payer: 59

## 2016-11-28 VITALS — BP 108/56 | Wt 162.0 lb

## 2016-11-28 DIAGNOSIS — O09293 Supervision of pregnancy with other poor reproductive or obstetric history, third trimester: Secondary | ICD-10-CM

## 2016-11-28 DIAGNOSIS — O0990 Supervision of high risk pregnancy, unspecified, unspecified trimester: Secondary | ICD-10-CM

## 2016-11-28 DIAGNOSIS — Z362 Encounter for other antenatal screening follow-up: Secondary | ICD-10-CM

## 2016-11-28 DIAGNOSIS — Z3A34 34 weeks gestation of pregnancy: Secondary | ICD-10-CM

## 2016-11-28 DIAGNOSIS — O09292 Supervision of pregnancy with other poor reproductive or obstetric history, second trimester: Secondary | ICD-10-CM

## 2016-11-28 DIAGNOSIS — Z3A29 29 weeks gestation of pregnancy: Secondary | ICD-10-CM

## 2016-11-28 DIAGNOSIS — Z8759 Personal history of other complications of pregnancy, childbirth and the puerperium: Secondary | ICD-10-CM

## 2016-11-28 NOTE — Progress Notes (Signed)
ROB Growth scan today Vaginal pressure/feels as if baby has dropped

## 2016-11-28 NOTE — Progress Notes (Signed)
Routine Prenatal Care Visit  Subjective  Elizabeth RowanJennifer Kramer is a 31 y.o. [id number] at 4597w2d being seen today for ongoing prenatal care.  She is currently monitored for the following issues for this high-risk pregnancy and has Supervision of high risk pregnancy, antepartum; Hx of preeclampsia, prior pregnancy, currently pregnant, third trimester; and History of IUFD on their problem list.  ----------------------------------------------------------------------------------- Patient reports no complaints.   Contractions: Not present. Vag. Bleeding: None.  Movement: Present. Denies leaking of fluid.  ----------------------------------------------------------------------------------- The following portions of the patient's history were reviewed and updated as appropriate: allergies, current medications, past family history, past medical history, past social history, past surgical history and problem list. Problem list updated.   Objective  Blood pressure (!) 108/56, weight 162 lb (73.5 kg), last menstrual period 04/02/2016. Pregravid weight 125 lb (56.7 kg) Total Weight Gain 37 lb (16.8 kg) Urinalysis: Urine Protein: Negative Urine Glucose: Negative  Fetal Status: Fetal Heart Rate (bpm): 140 Fundal Height: 33 cm Movement: Present     General:  Alert, oriented and cooperative. Patient is in no acute distress.  Skin: Skin is warm and dry. No rash noted.   Cardiovascular: Normal heart rate noted  Respiratory: Normal respiratory effort, no problems with respiration noted  Abdomen: Soft, gravid, appropriate for gestational age. Pain/Pressure: Present     Pelvic:  Cervical exam deferred        Extremities: Normal range of motion.     ental Status: Normal mood and affect. Normal behavior. Normal judgment and thought content.     Assessment   31 y.o. [id number] at 2397w2d by  01/07/2017, by Last Menstrual Period presenting for routine prenatal visit  Plan   pregnancy Problems (from 06/03/16 to  present)    Problem Noted Resolved   History of IUFD 10/14/2016 by Vena AustriaStaebler, Annalynne Ibanez, MD No   Overview Signed 10/14/2016  8:51 AM by Vena AustriaStaebler, Darragh Nay, MD    q4 week growth scans      Hx of preeclampsia, prior pregnancy, currently pregnant, third trimester 08/18/2016 by Conard NovakJackson, Stephen D, MD No   Overview Addendum 10/14/2016  8:48 AM by Vena AustriaStaebler, Delos Klich, MD    [X]  Aspirin 81 mg daily after 12 weeks; discontinue after 36 weeks  Patient with postpartum onset preeclampsia in prior pregnancies will need 1 week postpartum BP check  Baseline and surveillance [facility], refresh links as needed)  Lab Results  Component Value Date   PLT 160 06/03/2016   CREATININE 0.49 (L) 10/16/2013   AST 31 10/16/2013   ALT 28 10/16/2013   LABPROT 12.2 11/04/2012         Supervision of high risk pregnancy, antepartum 06/10/2016 by Tresea MallGledhill, Jane, CNM No   Overview Addendum 10/28/2016  9:48 PM by Vena AustriaStaebler, Kashia Brossard, MD    [facility] Prenatal Labs  Dating LMP = 9 week US Blood type: A/Positive/-- (06/01 1048)   Genetic Screen 1 Screen: negative Antibody:Negative (06/01 1048)  Anatomic US Normal female Rubella: Immune Varicella: Immune  GTT Third trimester:  RPR: Non Reactive (06/01 1048)   Rhogam N/A HBsAg: Negative (06/01 1048)   TDaP vaccine 10/28/16 Flu Shot: 10/14/16 HIV: Negative  Baby Food                                GBS:   Contraception  Pap: 03/04/2015 NIL HPV negative  CBB     CS/VBAC    Support Person  Preterm labor symptoms and general obstetric precautions including but not limited to vaginal bleeding, contractions, leaking of fluid and fetal movement were reviewed in detail with the patient. Please refer to After Visit Summary for other counseling recommendations.  - growth scan 5lbs 11oz c/w 57%ile, AFI 16 - normotensive today - start APT at 36 weeks given prior pregnancy loss and previous DP recommendations for APT  Return in about 2 weeks  (around 12/12/2016) for NST/AFI.

## 2016-12-12 ENCOUNTER — Encounter: Payer: 59 | Admitting: Obstetrics and Gynecology

## 2016-12-12 ENCOUNTER — Other Ambulatory Visit: Payer: 59

## 2016-12-14 ENCOUNTER — Other Ambulatory Visit: Payer: Self-pay | Admitting: Obstetrics and Gynecology

## 2016-12-14 DIAGNOSIS — O0990 Supervision of high risk pregnancy, unspecified, unspecified trimester: Secondary | ICD-10-CM

## 2016-12-14 DIAGNOSIS — O09293 Supervision of pregnancy with other poor reproductive or obstetric history, third trimester: Secondary | ICD-10-CM

## 2016-12-14 DIAGNOSIS — Z8759 Personal history of other complications of pregnancy, childbirth and the puerperium: Secondary | ICD-10-CM

## 2016-12-14 DIAGNOSIS — Z3A34 34 weeks gestation of pregnancy: Secondary | ICD-10-CM

## 2016-12-15 ENCOUNTER — Ambulatory Visit: Payer: 59 | Admitting: Obstetrics and Gynecology

## 2016-12-15 ENCOUNTER — Ambulatory Visit (INDEPENDENT_AMBULATORY_CARE_PROVIDER_SITE_OTHER): Payer: 59

## 2016-12-15 VITALS — BP 102/58 | Wt 165.0 lb

## 2016-12-15 DIAGNOSIS — O0990 Supervision of high risk pregnancy, unspecified, unspecified trimester: Secondary | ICD-10-CM

## 2016-12-15 DIAGNOSIS — O09293 Supervision of pregnancy with other poor reproductive or obstetric history, third trimester: Secondary | ICD-10-CM | POA: Diagnosis not present

## 2016-12-15 DIAGNOSIS — Z3A34 34 weeks gestation of pregnancy: Secondary | ICD-10-CM | POA: Diagnosis not present

## 2016-12-15 DIAGNOSIS — Z3A36 36 weeks gestation of pregnancy: Secondary | ICD-10-CM

## 2016-12-15 DIAGNOSIS — Z8759 Personal history of other complications of pregnancy, childbirth and the puerperium: Secondary | ICD-10-CM

## 2016-12-15 DIAGNOSIS — Z362 Encounter for other antenatal screening follow-up: Secondary | ICD-10-CM | POA: Diagnosis not present

## 2016-12-15 NOTE — Progress Notes (Signed)
Routine Prenatal Care Visit  Subjective  Elizabeth RowanJennifer Kramer is a 31 y.o. [id number] at 6256w1d being seen today for ongoing prenatal care.  She is currently monitored for the following issues for this high-risk pregnancy and has Supervision of high risk pregnancy, antepartum; Hx of preeclampsia, prior pregnancy, currently pregnant, third trimester; and History of IUFD on their problem list.  ----------------------------------------------------------------------------------- Patient reports abdominal pain and lower back, unsure if braxton hicks or true ctx. NST/AFI today. Baby moving well.   NST: 135 baseline, moderate variability, 15x15 accelerations AFI 19cm  Contractions: Irregular. Vag. Bleeding: None.  Movement: Present. Denies leaking of fluid.  ----------------------------------------------------------------------------------- The following portions of the patient's history were reviewed and updated as appropriate: allergies, current medications, past family history, past medical history, past social history, past surgical history and problem list. Problem list updated.   Objective  Blood pressure (!) 102/58, weight 165 lb (74.8 kg), last menstrual period 04/02/2016. Pregravid weight 125 lb (56.7 kg) Total Weight Gain 40 lb (18.1 kg) Urinalysis: Urine Protein: Negative Urine Glucose: Negative  Fetal Status: Fetal Heart Rate (bpm): 135 Fundal Height: 35 cm Movement: Present     General:  Alert, oriented and cooperative. Patient is in no acute distress.  Skin: Skin is warm and dry. No rash noted.   Cardiovascular: Normal heart rate noted  Respiratory: Normal respiratory effort, no problems with respiration noted  Abdomen: Soft, gravid, appropriate for gestational age. Pain/Pressure: Present     Pelvic:  Cervical exam deferred        Extremities: Normal range of motion.     ental Status: Normal mood and affect. Normal behavior. Normal judgment and thought content.     Assessment    31 y.o. [id number] at 7256w1d by  01/07/2017, by Last Menstrual Period presenting for routine prenatal visit  Plan   pregnancy Problems (from 06/03/16 to present)    Problem Noted Resolved   History of IUFD 10/14/2016 by Vena AustriaStaebler, Andreas, MD No   Overview Signed 10/14/2016  8:51 AM by Vena AustriaStaebler, Andreas, MD    q4 week growth scans      Hx of preeclampsia, prior pregnancy, currently pregnant, third trimester 08/18/2016 by Conard NovakJackson, Stephen D, MD No   Overview Addendum 10/14/2016  8:48 AM by Vena AustriaStaebler, Andreas, MD    [X]  Aspirin 81 mg daily after 12 weeks; discontinue after 36 weeks  Patient with postpartum onset preeclampsia in prior pregnancies will need 1 week postpartum BP check  Baseline and surveillance labs ([facility], refresh links as needed)  Lab Results  Component Value Date   PLT 160 06/03/2016   CREATININE 0.49 (L) 10/16/2013   AST 31 10/16/2013   ALT 28 10/16/2013   LABPROT 12.2 11/04/2012         Supervision of high risk pregnancy, antepartum 06/10/2016 by Tresea MallGledhill, Jane, CNM No   Overview Addendum 12/15/2016  2:20 PM by Natale MilchSchuman, Milam Allbaugh R, MD    [facility] Prenatal Labs  Dating LMP = 9 week US Blood type: A/Positive/-- (06/01 1048)   Genetic Screen 1 Screen: negative Antibody:Negative (06/01 1048)  Anatomic US Normal female Rubella: Immune Varicella: Immune  GTT Third trimester: 65 RPR: Non Reactive (06/01 1048)   Rhogam N/A HBsAg: Negative (06/01 1048)   TDaP vaccine 10/28/16 Flu Shot: 10/14/16 HIV: Negative  Baby Food    Breast &bottle                      GBS:   Contraception  IUD Pap: 03/04/2015 NIL HPV negative  CBB   Considering   CS/VBAC    Support Person                Preterm labor symptoms and general obstetric precautions including but not limited to vaginal bleeding, contractions, leaking of fluid and fetal movement were reviewed in detail with the patient. Please refer to After Visit Summary for other counseling recommendations.    Return in about 1 week (around 12/22/2016) for NST/ AFI ROB.

## 2016-12-18 LAB — GC/CHLAMYDIA PROBE AMP
CHLAMYDIA, DNA PROBE: NEGATIVE
Neisseria gonorrhoeae by PCR: NEGATIVE

## 2016-12-18 LAB — CULTURE, BETA STREP (GROUP B ONLY): STREP GP B CULTURE: POSITIVE — AB

## 2016-12-19 NOTE — Progress Notes (Signed)
Call and spoke with patient. She understand that she will need antibiotics in labor. Has tolerated keflex before.

## 2016-12-22 ENCOUNTER — Observation Stay
Admission: EM | Admit: 2016-12-22 | Discharge: 2016-12-22 | Disposition: A | Discharge status: Home / Self Care | Payer: 59 | Attending: Obstetrics and Gynecology | Admitting: Obstetrics and Gynecology

## 2016-12-22 ENCOUNTER — Ambulatory Visit (INDEPENDENT_AMBULATORY_CARE_PROVIDER_SITE_OTHER): Payer: 59 | Admitting: Obstetrics and Gynecology

## 2016-12-22 ENCOUNTER — Ambulatory Visit (INDEPENDENT_AMBULATORY_CARE_PROVIDER_SITE_OTHER): Payer: 59

## 2016-12-22 ENCOUNTER — Other Ambulatory Visit: Payer: Self-pay

## 2016-12-22 VITALS — BP 118/72 | Wt 168.0 lb

## 2016-12-22 DIAGNOSIS — O368330 Maternal care for abnormalities of the fetal heart rate or rhythm, third trimester, not applicable or unspecified: Secondary | ICD-10-CM | POA: Diagnosis not present

## 2016-12-22 DIAGNOSIS — Z362 Encounter for other antenatal screening follow-up: Secondary | ICD-10-CM

## 2016-12-22 DIAGNOSIS — Z3A37 37 weeks gestation of pregnancy: Secondary | ICD-10-CM | POA: Diagnosis not present

## 2016-12-22 DIAGNOSIS — O0990 Supervision of high risk pregnancy, unspecified, unspecified trimester: Secondary | ICD-10-CM | POA: Diagnosis not present

## 2016-12-22 DIAGNOSIS — Z8759 Personal history of other complications of pregnancy, childbirth and the puerperium: Secondary | ICD-10-CM

## 2016-12-22 DIAGNOSIS — O09293 Supervision of pregnancy with other poor reproductive or obstetric history, third trimester: Secondary | ICD-10-CM

## 2016-12-22 DIAGNOSIS — O368390 Maternal care for abnormalities of the fetal heart rate or rhythm, unspecified trimester, not applicable or unspecified: Secondary | ICD-10-CM | POA: Diagnosis present

## 2016-12-22 NOTE — Discharge Summary (Signed)
Physician Discharge Summary   Patient ID: Elizabeth Kramer [id number] 31 y.o. 09/03/1985  Admit date: 12/22/2016  Discharge date and time: No discharge date for patient encounter.   Admitting Physician: Natale Milchhristanna R Schuman, MD   Discharge Physician: Jerene PitchSchuman MD  Admission Diagnoses: 37 weeks  Discharge Diagnoses: Reassuring fetal status  Admission Condition: good  Discharged Condition: good  Indication for Admission: Fetal monitoring  Hospital Course: Patient was monitored for2 hours. Reassuring fetal status. Will discharge home.  Consults: None  Significant Diagnostic Studies: None  Treatments: None  Disposition: [facility] Not Defined  Patient Instru Activity: activity as tolerated Diet: regular diet Wound Care: none needed  Follow-up with OBGYN  in 1 week.  Signed: Natale MilchChristanna R Schuman 12/22/2016 7:33 PM

## 2016-12-22 NOTE — Progress Notes (Signed)
AFI/NST 

## 2016-12-22 NOTE — OB Triage Note (Signed)
[facility] Sent per Dr. Bonney AidStaebler for NST due to having a deceleration heard in the office.  Pt has a hx of 25 week fetal loss. Pt denies leaking of fluid or spotting. Ellison Carwin Wilder Amodei RNC

## 2016-12-22 NOTE — Progress Notes (Signed)
Routine Prenatal Care Visit  Subjective  Elizabeth Kramer is a 31 y.o. [id number] at 5948w5d being seen today for ongoing prenatal care.  She is currently monitored for the following issues for this high-risk pregnancy and has Supervision of high risk pregnancy, antepartum; Hx of preeclampsia, prior pregnancy, currently pregnant, third trimester; and History of IUFD on their problem list.  ----------------------------------------------------------------------------------- Patient reports no complaints.   Contractions: Irregular. Vag. Bleeding: None.  Movement: Present. Denies leaking of fluid.  ----------------------------------------------------------------------------------- The following portions of the patient's history were reviewed and updated as appropriate: allergies, current medications, past family history, past medical history, past social history, past surgical history and problem list. Problem list updated.   Objective  Blood pressure 118/72, weight 168 lb (76.2 kg), last menstrual period 04/02/2016. Pregravid weight 125 lb (56.7 kg) Total Weight Gain 43 lb (19.5 kg) Urinalysis:      Fetal Status: Fetal Heart Rate (bpm): 125   Movement: Present     General:  Alert, oriented and cooperative. Patient is in no acute distress.  Skin: Skin is warm and dry. No rash noted.   Cardiovascular: Normal heart rate noted  Respiratory: Normal respiratory effort, no problems with respiration noted  Abdomen: Soft, gravid, appropriate for gestational age. Pain/Pressure: Present     Pelvic:  Cervical exam deferred        Extremities: Normal range of motion.     ental Status: Normal mood and affect. Normal behavior. Normal judgment and thought content.   Baseline: 125 Variability: moderate Accelerations: present Decelerations: present one deceleration to 95 following an acceleration lasting about 2min with slow return to baseline Tocometry: N/A The patient was monitored for 30 minutes, fetal  heart rate tracing was deemed reactive, category I tracing,  CPT [id number]   Assessment   31 y.o. [id number] at 8848w5d by  01/07/2017, by Last Menstrual Period presenting for routine prenatal visit  Plan   pregnancy Problems (from 06/03/16 to present)    Problem Noted Resolved   History of IUFD 10/14/2016 by Vena AustriaStaebler, Mattia Osterman, MD No   Overview Signed 10/14/2016  8:51 AM by Vena AustriaStaebler, Kirti Carl, MD    q4 week growth scans      Hx of preeclampsia, prior pregnancy, currently pregnant, third trimester 08/18/2016 by Conard NovakJackson, Stephen D, MD No   Overview Addendum 10/14/2016  8:48 AM by Vena AustriaStaebler, Khyleigh Furney, MD    [X]  Aspirin 81 mg daily after 12 weeks; discontinue after 36 weeks  Patient with postpartum onset preeclampsia in prior pregnancies will need 1 week postpartum BP check  Baseline and surveillance labs [facility], refresh links as needed)  Lab Results  Component Value Date   PLT 160 06/03/2016   CREATININE 0.49 (L) 10/16/2013   AST 31 10/16/2013   ALT 28 10/16/2013   LABPROT 12.2 11/04/2012         Supervision of high risk pregnancy, antepartum 06/10/2016 by Tresea MallGledhill, Jane, CNM No   Overview Addendum 12/15/2016  2:20 PM by Natale MilchSchuman, Christanna R, MD    [facility] Prenatal Labs  Dating LMP = 9 week US Blood type: A/Positive/-- (06/01 1048)   Genetic Screen 1 Screen: negative Antibody:Negative (06/01 1048)  Anatomic US Normal female Rubella: Immune Varicella: Immune  GTT Third trimester: 65 RPR: Non Reactive (06/01 1048)   Rhogam N/A HBsAg: Negative (06/01 1048)   TDaP vaccine 10/28/16 Flu Shot: 10/14/16 HIV: Negative  Baby Food    Breast &bottle  GBS: Positive  Contraception  IUD Pap: 03/04/2015 NIL HPV negative  CBB   Considering   CS/VBAC    Support Person                Term labor symptoms and general obstetric precautions including but not limited to vaginal bleeding, contractions, leaking of fluid and fetal movement were reviewed in detail  with the patient. Please refer to After Visit Summary for other counseling recommendations.  - Potential late deceleration, to L&D for prolonged monitoring.  Good fetal movement, AFI 10cm.  - Follow up NST/AFI 1 week - Plan for delivery in the 39th week Return in about 1 week (around 12/29/2016) for NST/AFI ROB 1 week.

## 2016-12-22 NOTE — Discharge Instructions (Signed)
Return to [facility]place for:  Contractions every 5 min lasting a min for 1 hour  Decreased Fetal movement- Monitor kick counts  Leaking of fluid  Vaginal bleeding   Headache, dizziness, blurry vision, swelling in extremities     Follow up with your next appt on 12/27 IOL scheduled on 12/29 at 0800

## 2016-12-22 NOTE — OB Triage Provider Note (Signed)
  History     CSN: [id number]  Arrival date and time: 12/22/16 1707   CC: Fetal heart rate decleration in office.   HPI  Patient was sent to the hospital. She has a NST in the office with a fetal heart rate deceleration to the 90s.  She was sent for extended monitoring. Today her AFI was 10cm. Patient is being monitored for a history of IUFD at 25 weeks.   OB History    Gravida Para Term Preterm AB Living   5 2 2   2 2    SAB TAB Ectopic Multiple Live Births   2       2      Past Medical History:  Diagnosis Date  . Preeclampsia in postpartum period     No past surgical history on file.  Family History  Problem Relation Age of Onset  . Colon cancer Maternal Uncle   . Lung cancer Maternal Grandfather   . Diabetes Paternal Grandmother     Social History   Tobacco Use  . Smoking status: Never Smoker  . Smokeless tobacco: Never Used  Substance Use Topics  . Alcohol use: No  . Drug use: No    Allergies:  Allergies  Allergen Reactions  . Penicillins Rash    Medications Prior to Admission  Medication Sig Dispense Refill Last Dose  . aspirin EC 81 MG tablet Take by mouth.   Taking  . Prenatal Vit-Fe Fumarate-FA (PRENATAL VITAMIN PO) Take by mouth.   Taking    Review of Systems  Constitutional: Negative.  Negative for fatigue and fever.  HENT: Negative.  Negative for congestion and sinus pain.   Respiratory: Negative.  Negative for cough, shortness of breath and wheezing.   Cardiovascular: Negative for chest pain and palpitations.  Genitourinary: Negative for difficulty urinating, dyspareunia, dysuria and pelvic pain.  Neurological: Negative for dizziness and headaches.  Psychiatric/Behavioral: Negative for agitation and behavioral problems.    FHT: 125 baseline, moderate variability, 15x15 accelerations. No decelerations Toco: Irregular.  Assessment and Plan  Will discharge patient home. Follow up closely next week in office.  Christanna R  Schuman 12/22/2016, 6:49 PM

## 2016-12-29 ENCOUNTER — Other Ambulatory Visit: Payer: Self-pay | Admitting: Obstetrics and Gynecology

## 2016-12-29 ENCOUNTER — Ambulatory Visit (INDEPENDENT_AMBULATORY_CARE_PROVIDER_SITE_OTHER): Payer: 59 | Admitting: Obstetrics and Gynecology

## 2016-12-29 ENCOUNTER — Ambulatory Visit (INDEPENDENT_AMBULATORY_CARE_PROVIDER_SITE_OTHER): Payer: 59

## 2016-12-29 VITALS — BP 108/62 | Wt 170.0 lb

## 2016-12-29 DIAGNOSIS — Z8759 Personal history of other complications of pregnancy, childbirth and the puerperium: Secondary | ICD-10-CM | POA: Diagnosis not present

## 2016-12-29 DIAGNOSIS — Z3A37 37 weeks gestation of pregnancy: Secondary | ICD-10-CM

## 2016-12-29 DIAGNOSIS — O0990 Supervision of high risk pregnancy, unspecified, unspecified trimester: Secondary | ICD-10-CM

## 2016-12-29 DIAGNOSIS — O09293 Supervision of pregnancy with other poor reproductive or obstetric history, third trimester: Secondary | ICD-10-CM | POA: Diagnosis not present

## 2016-12-29 DIAGNOSIS — Z3A38 38 weeks gestation of pregnancy: Secondary | ICD-10-CM

## 2016-12-29 NOTE — Progress Notes (Signed)
Obstetric H&P   Chief Complaint: Schedule Induction of Labor  Prenatal Care Provider: [facility]  History of Present Illness: 31 y.o. [id number] 717w5d by 01/07/2017, by Last Menstrual Period presenting to L&D for induction of labor secondary to history of prior 25 week IUFD with hydropic features.  She also has a history of postpartum preeclampsia with all of her prior pregnancies.  Pregnancy thus far has been uncomplicated.  Growth scans have been appropriate as has third trimester antepartum testing.  Given history of prior IUFD recommendation from prior pregnancy by DP was delivery by 39 weeks.    Pregravid weight 125 lb (56.7 kg) Total Weight Gain 45 lb (20.4 kg)  pregnancy Problems (from 06/03/16 to present)    Problem Noted Resolved   History of IUFD 10/14/2016 by Vena AustriaStaebler, Marquise Wicke, MD No   Overview Signed 10/14/2016  8:51 AM by Vena AustriaStaebler, Tameria Patti, MD    q4 week growth scans      Hx of preeclampsia, prior pregnancy, currently pregnant, third trimester 08/18/2016 by Conard NovakJackson, Stephen D, MD No   Overview Addendum 10/14/2016  8:48 AM by Vena AustriaStaebler, Shahram Alexopoulos, MD    [X]  Aspirin 81 mg daily after 12 weeks; discontinue after 36 weeks  Patient with postpartum onset preeclampsia in prior pregnancies will need 1 week postpartum BP check  Baseline and surveillance labs ([facility], refresh links as needed)  Lab Results  Component Value Date   PLT 160 06/03/2016   CREATININE 0.49 (L) 10/16/2013   AST 31 10/16/2013   ALT 28 10/16/2013   LABPROT 12.2 11/04/2012         Supervision of high risk pregnancy, antepartum 06/10/2016 by Tresea MallGledhill, Jane, CNM No   Overview Addendum 12/22/2016  5:07 PM by Vena AustriaStaebler, Ishmael Berkovich, MD    [facility] Prenatal Labs  Dating LMP = 9 week US Blood type: A/Positive/-- (06/01 1048)   Genetic Screen 1 Screen: negative Antibody:Negative (06/01 1048)  Anatomic US Normal female Rubella: Immune Varicella: Immune  GTT Third trimester: 65 RPR: Non Reactive (06/01 1048)    Rhogam N/A HBsAg: Negative (06/01 1048)   TDaP vaccine 10/28/16 Flu Shot: 10/14/16 HIV: Negative  Baby Food    Breast &bottle                      GBS: negative  Contraception  IUD Pap: 03/04/2015 NIL HPV negative  CBB   Considering   CS/VBAC    Support Person                Review of Systems: 10 point review of systems negative unless otherwise noted in HPI  Past Medical History: Past Medical History:  Diagnosis Date  . Preeclampsia in postpartum period     Past Surgical History: No past surgical history on file.  Past Obstetric History:  Past Gynecologic History:  Family History: Family History  Problem Relation Age of Onset  . Colon cancer Maternal Uncle   . Lung cancer Maternal Grandfather   . Diabetes Paternal Grandmother     Social History: Social History   Socioeconomic History  . Marital status: Married    Spouse name: Not on file  . Number of children: Not on file  . Years of education: Not on file  . Highest education level: Not on file  Social Needs  . Financial resource strain: Not on file  . Food insecurity - worry: Not on file  . Food insecurity - inability: Not on file  . Transportation needs -  medical: Not on file  . Transportation needs - non-medical: Not on file  Occupational History  . Not on file  Tobacco Use  . Smoking status: Never Smoker  . Smokeless tobacco: Never Used  Substance and Sexual Activity  . Alcohol use: No  . Drug use: No  . Sexual activity: Yes    Birth control/protection: None  Other Topics Concern  . Not on file  Social History Narrative  . Not on file    Medications: Prior to Admission medications   Medication Sig Start Date End Date Taking? Authorizing Provider  aspirin EC 81 MG tablet Take by mouth.   Yes [provider]  Prenatal Vit-Fe Fumarate-FA (PRENATAL VITAMIN PO) Take by mouth.   Yes [provider]    Allergies: Allergies  Allergen Reactions  . Penicillins Rash     Physical Exam: Vitals: Blood pressure 108/62, weight 170 lb (77.1 kg), last menstrual period 04/02/2016.  FHT: 140  General: NAD HEENT: normocephalic, anicteric Pulmonary: No increased work of breathing Cardiovascular: RRR, distal pulses 2+ Abdomen: Gravid, non-tender Leopolds: vtx Genitourinary: 3.5/50/-2 Extremities: no edema, erythema, or tenderness Neurologic: Grossly intact Psychiatric: mood appropriate, affect full  Baseline: 140  Variability: moderate Accelerations: present Decelerations: absent Tocometry: N/A The patient was monitored for 30 minutes, fetal heart rate tracing was deemed reactive, category I tracing,  Labs: No results found for this or any previous visit (from the past 24 hour(s)).  Assessment: 31 y.o. [id number] 7032w5d by 01/07/2017, by Last Menstrual Period presenting for IOL scheduling  Plan: 1) IOL - scheduled for 01/01/17 pitocing  2) Fetus - appropriate APT with reactive NST today  3) PNL - see HPI  4) Immunization History -  Immunization History  Administered Date(s) Administered  . Influenza,inj,Quad PF,6+ Mos 10/14/2016  . Tdap 10/28/2016    5) Disposition - pending delivery, will need 1 week postpartum BP check given strong history of postpartum preeclampsia

## 2016-12-29 NOTE — Progress Notes (Signed)
ROB U/S and NST

## 2017-01-01 ENCOUNTER — Other Ambulatory Visit: Payer: Self-pay

## 2017-01-01 ENCOUNTER — Inpatient Hospital Stay
Admission: EM | Admit: 2017-01-01 | Discharge: 2017-01-03 | DRG: 807 | Disposition: A | Discharge status: Home / Self Care | Payer: 59 | Attending: Obstetrics and Gynecology | Admitting: Obstetrics and Gynecology

## 2017-01-01 DIAGNOSIS — Z3A39 39 weeks gestation of pregnancy: Secondary | ICD-10-CM

## 2017-01-01 DIAGNOSIS — Z8759 Personal history of other complications of pregnancy, childbirth and the puerperium: Secondary | ICD-10-CM

## 2017-01-01 DIAGNOSIS — O26893 Other specified pregnancy related conditions, third trimester: Principal | ICD-10-CM | POA: Diagnosis present

## 2017-01-01 DIAGNOSIS — O09293 Supervision of pregnancy with other poor reproductive or obstetric history, third trimester: Secondary | ICD-10-CM

## 2017-01-01 DIAGNOSIS — Z3A38 38 weeks gestation of pregnancy: Secondary | ICD-10-CM | POA: Diagnosis not present

## 2017-01-01 DIAGNOSIS — Z880 Allergy status to penicillin: Secondary | ICD-10-CM

## 2017-01-01 DIAGNOSIS — O0990 Supervision of high risk pregnancy, unspecified, unspecified trimester: Secondary | ICD-10-CM

## 2017-01-01 LAB — CBC
HEMATOCRIT: 33.2 % — AB (ref 35.0–47.0)
Hemoglobin: 11.2 g/dL — ABNORMAL LOW (ref 12.0–16.0)
MCH: 28.8 pg (ref 26.0–34.0)
MCHC: 33.8 g/dL (ref 32.0–36.0)
MCV: 85.4 fL (ref 80.0–100.0)
Platelets: 144 10*3/uL — ABNORMAL LOW (ref 150–440)
RBC: 3.89 MIL/uL (ref 3.80–5.20)
RDW: 13.5 % (ref 11.5–14.5)
WBC: 11.2 10*3/uL — ABNORMAL HIGH (ref 3.6–11.0)

## 2017-01-01 LAB — COMPREHENSIVE METABOLIC PANEL
ALT: 14 U/L (ref 14–54)
AST: 27 U/L (ref 15–41)
Albumin: 3.1 g/dL — ABNORMAL LOW (ref 3.5–5.0)
Alkaline Phosphatase: 261 U/L — ABNORMAL HIGH (ref 38–126)
Anion gap: 8 (ref 5–15)
BILIRUBIN TOTAL: 0.7 mg/dL (ref 0.3–1.2)
BUN: 8 mg/dL (ref 6–20)
CHLORIDE: 104 mmol/L (ref 101–111)
CO2: 21 mmol/L — ABNORMAL LOW (ref 22–32)
CREATININE: 0.46 mg/dL (ref 0.44–1.00)
Calcium: 8.8 mg/dL — ABNORMAL LOW (ref 8.9–10.3)
GFR calc Af Amer: >60 mL/min (ref >60)
Glucose, Bld: 85 mg/dL (ref 65–99)
Potassium: 3.8 mmol/L (ref 3.5–5.1)
Sodium: 133 mmol/L — ABNORMAL LOW (ref 135–145)
Total Protein: 6.4 g/dL — ABNORMAL LOW (ref 6.5–8.1)

## 2017-01-01 LAB — PROTEIN / CREATININE RATIO, URINE
CREATININE, URINE: 26 mg/dL
Total Protein, Urine: <6 mg/dL

## 2017-01-01 LAB — TYPE AND SCREEN
ABO/RH(D): A POS
Antibody Screen: NEGATIVE

## 2017-01-01 MED ORDER — CEFAZOLIN SODIUM-DEXTROSE 1-4 GM/50ML-% IV SOLN
INTRAVENOUS | Status: AC
  Administered 2017-01-01: 1 g via INTRAVENOUS
  Filled 2017-01-01: qty 50

## 2017-01-01 MED ORDER — CEFAZOLIN SODIUM 10 G IJ SOLR
2.0000 g | Freq: Once | INTRAMUSCULAR | Status: AC
  Administered 2017-01-01: 2 g via INTRAVENOUS
  Filled 2017-01-01: qty 2000

## 2017-01-01 MED ORDER — WITCH HAZEL-GLYCERIN EX PADS: 1.0000 "application " | MEDICATED_PAD | CUTANEOUS | Status: DC | PRN

## 2017-01-01 MED ORDER — BENZOCAINE-MENTHOL 20-0.5 % EX AERO
1.0000 "application " | INHALATION_SPRAY | CUTANEOUS | Status: DC | PRN
  Administered 2017-01-01: 1 via TOPICAL
  Filled 2017-01-01: qty 56

## 2017-01-01 MED ORDER — OXYCODONE-ACETAMINOPHEN 5-325 MG PO TABS: 2.0000 | ORAL_TABLET | ORAL | Status: DC | PRN

## 2017-01-01 MED ORDER — AMMONIA AROMATIC IN INHA
RESPIRATORY_TRACT | Status: AC
  Filled 2017-01-01: qty 10

## 2017-01-01 MED ORDER — CEFAZOLIN SODIUM-DEXTROSE 1-4 GM/50ML-% IV SOLN
1.0000 g | Freq: Three times a day (TID) | INTRAVENOUS | Status: DC
  Administered 2017-01-01: 1 g via INTRAVENOUS

## 2017-01-01 MED ORDER — IBUPROFEN 600 MG PO TABS
600.0000 mg | ORAL_TABLET | Freq: Four times a day (QID) | ORAL | Status: DC
  Administered 2017-01-01 – 2017-01-03 (×7): 600 mg via ORAL
  Filled 2017-01-01 (×7): qty 1

## 2017-01-01 MED ORDER — SIMETHICONE 80 MG PO CHEW: 80.0000 mg | CHEWABLE_TABLET | ORAL | Status: DC | PRN

## 2017-01-01 MED ORDER — PRENATAL MULTIVITAMIN CH
1.0000 | ORAL_TABLET | Freq: Every day | ORAL | Status: DC
  Administered 2017-01-02 – 2017-01-03 (×2): 1 via ORAL
  Filled 2017-01-01 (×2): qty 1

## 2017-01-01 MED ORDER — ACETAMINOPHEN 325 MG PO TABS: 650.0000 mg | ORAL_TABLET | ORAL | Status: DC | PRN

## 2017-01-01 MED ORDER — ONDANSETRON HCL 4 MG PO TABS: 4.0000 mg | ORAL_TABLET | ORAL | Status: DC | PRN

## 2017-01-01 MED ORDER — ACETAMINOPHEN 325 MG PO TABS
650.0000 mg | ORAL_TABLET | ORAL | Status: DC | PRN
Start: 2017-01-01 — End: 2017-01-03
  Administered 2017-01-01 – 2017-01-02 (×2): 650 mg via ORAL
  Filled 2017-01-01 (×2): qty 2

## 2017-01-01 MED ORDER — LIDOCAINE HCL (PF) 1 % IJ SOLN: 30.0000 mL | INTRAMUSCULAR | Status: DC | PRN

## 2017-01-01 MED ORDER — DIBUCAINE 1 % RE OINT: 1.0000 "application " | TOPICAL_OINTMENT | RECTAL | Status: DC | PRN

## 2017-01-01 MED ORDER — LACTATED RINGERS IV SOLN
500.0000 mL | INTRAVENOUS | Status: DC | PRN
  Administered 2017-01-01 (×2): 500 mL via INTRAVENOUS

## 2017-01-01 MED ORDER — DIPHENHYDRAMINE HCL 25 MG PO CAPS: 25.0000 mg | ORAL_CAPSULE | Freq: Four times a day (QID) | ORAL | Status: DC | PRN

## 2017-01-01 MED ORDER — COCONUT OIL OIL
1.0000 "application " | TOPICAL_OIL | Status: DC | PRN
  Administered 2017-01-02: 1 via TOPICAL
  Filled 2017-01-01: qty 120

## 2017-01-01 MED ORDER — OXYTOCIN 10 UNIT/ML IJ SOLN
INTRAMUSCULAR | Status: AC
  Filled 2017-01-01: qty 2

## 2017-01-01 MED ORDER — OXYTOCIN 40 UNITS IN LACTATED RINGERS INFUSION - SIMPLE MED
2.5000 [IU]/h | INTRAVENOUS | Status: DC
  Filled 2017-01-01: qty 1000

## 2017-01-01 MED ORDER — TERBUTALINE SULFATE 1 MG/ML IJ SOLN: 0.2500 mg | Freq: Once | INTRAMUSCULAR | Status: DC | PRN

## 2017-01-01 MED ORDER — ONDANSETRON HCL 4 MG/2ML IJ SOLN: 4.0000 mg | INTRAMUSCULAR | Status: DC | PRN

## 2017-01-01 MED ORDER — OXYCODONE-ACETAMINOPHEN 5-325 MG PO TABS: 1.0000 | ORAL_TABLET | ORAL | Status: DC | PRN

## 2017-01-01 MED ORDER — LIDOCAINE HCL (PF) 1 % IJ SOLN
INTRAMUSCULAR | Status: AC
  Filled 2017-01-01: qty 30

## 2017-01-01 MED ORDER — OXYTOCIN BOLUS FROM INFUSION
500.0000 mL | Freq: Once | INTRAVENOUS | Status: AC
  Administered 2017-01-01: 500 mL via INTRAVENOUS

## 2017-01-01 MED ORDER — ONDANSETRON HCL 4 MG/2ML IJ SOLN: 4.0000 mg | Freq: Four times a day (QID) | INTRAMUSCULAR | Status: DC | PRN

## 2017-01-01 MED ORDER — OXYTOCIN 40 UNITS IN LACTATED RINGERS INFUSION - SIMPLE MED
1.0000 m[IU]/min | INTRAVENOUS | Status: DC
  Administered 2017-01-01: 5 m[IU]/min via INTRAVENOUS
  Administered 2017-01-01: 1 m[IU]/min via INTRAVENOUS

## 2017-01-01 MED ORDER — OXYTOCIN 40 UNITS IN LACTATED RINGERS INFUSION - SIMPLE MED: 1.0000 m[IU]/min | INTRAVENOUS | Status: DC

## 2017-01-01 MED ORDER — SOD CITRATE-CITRIC ACID 500-334 MG/5ML PO SOLN: 30.0000 mL | ORAL | Status: DC | PRN

## 2017-01-01 MED ORDER — FENTANYL 2.5 MCG/ML W/ROPIVACAINE 0.15% IN NS 100 ML EPIDURAL (ARMC)
EPIDURAL | Status: AC
  Filled 2017-01-01: qty 100

## 2017-01-01 MED ORDER — SENNOSIDES-DOCUSATE SODIUM 8.6-50 MG PO TABS
2.0000 | ORAL_TABLET | ORAL | Status: DC
  Administered 2017-01-03: 2 via ORAL
  Filled 2017-01-01 (×2): qty 2

## 2017-01-01 MED ORDER — LACTATED RINGERS IV SOLN
INTRAVENOUS | Status: DC
  Administered 2017-01-01 (×2): via INTRAVENOUS

## 2017-01-01 NOTE — Progress Notes (Signed)
   Subjective:  Doing well, no concerns.  Comfortable with epidural in place  Objective:   Vitals: Blood pressure 117/64, pulse 83, temperature 98.3 F (36.8 C), temperature source Axillary, resp. rate 16, height 5\' 6"  (1.676 m), weight 170 lb (77.1 kg), last menstrual period 04/02/2016, SpO2 100 %. General:  Abdomen: Cervical Exam:  Dilation: 6 Effacement (%): 90 Station: 0 Presentation: Vertex Exam by:: Dr Bonney AidStaebler  FHT: 130, moderate, no accels, occasional earlies Toco: q2-283min  Results for orders placed or performed during the hospital encounter of 01/01/17 (from the past 24 hour(s))  CBC     Status: Abnormal   Collection Time: 01/01/17  9:01 AM  Result Value Ref Range   WBC 11.2 (H) 3.6 - 11.0 K/uL   RBC 3.89 3.80 - 5.20 MIL/uL   Hemoglobin 11.2 (L) 12.0 - 16.0 g/dL   HCT 16.133.2 (L) 09.635.0 - 04.547.0 %   MCV 85.4 80.0 - 100.0 fL   MCH 28.8 26.0 - 34.0 pg   MCHC 33.8 32.0 - 36.0 g/dL   RDW 40.913.5 81.111.5 - 91.414.5 %   Platelets 144 (L) 150 - 440 K/uL  Type and screen [facility]     Status: None   Collection Time: 01/01/17  9:01 AM  Result Value Ref Range   ABO/RH(D) A POS    Antibody Screen NEG    Sample Expiration      01/04/2017 Performed at [facility], [address] [place], [place] [address]   Comprehensive metabolic panel     Status: Abnormal   Collection Time: 01/01/17  9:01 AM  Result Value Ref Range   Sodium 133 (L) 135 - 145 mmol/L   Potassium 3.8 3.5 - 5.1 mmol/L   Chloride 104 101 - 111 mmol/L   CO2 21 (L) 22 - 32 mmol/L   Glucose, Bld 85 65 - 99 mg/dL   BUN 8 6 - 20 mg/dL   Creatinine, Ser 6.210.46 0.44 - 1.00 mg/dL   Calcium 8.8 (L) 8.9 - 10.3 mg/dL   Total Protein 6.4 (L) 6.5 - 8.1 g/dL   Albumin 3.1 (L) 3.5 - 5.0 g/dL   AST 27 15 - 41 U/L   ALT 14 14 - 54 U/L   Alkaline Phosphatase 261 (H) 38 - 126 U/L   Total Bilirubin 0.7 0.3 - 1.2 mg/dL   GFR calc non Af Amer >60 >60 mL/min   GFR calc Af Amer >60 >60 mL/min   Anion gap  8 5 - 15  Protein / creatinine ratio, urine     Status: None   Collection Time: 01/01/17  9:01 AM  Result Value Ref Range   Creatinine, Urine 26 mg/dL   Total Protein, Urine <6 mg/dL   Protein Creatinine Ratio        0.00 - 0.15 mg/mg[Cre]    Assessment:   31 y.o. [id number] 7151w1d IOL history of IUFD  Plan:   1) Labor - continue pitocin, good cervical change on last check  2) Fetus - cat I tracing

## 2017-01-01 NOTE — Progress Notes (Signed)
   Subjective:  No concerns, feeling increasing strength to contractions  Objective:   Vitals: Blood pressure 119/69, pulse 90, temperature 98.8 F (37.1 C), temperature source Oral, resp. rate 16, height 5\' 6"  (1.676 m), weight 170 lb (77.1 kg), last menstrual period 04/02/2016. General: NAD Abdomen: soft, non-tender Cervical Exam:  Dilation: 4 Effacement (%): 70 Station: -2 Presentation: Vertex Exam by:: Samaa Ueda  FHT: 135, moderate, +accels, no decels Toco:q3-164min  Results for orders placed or performed during the hospital encounter of 01/01/17 (from the past 24 hour(s))  CBC     Status: Abnormal   Collection Time: 01/01/17  9:01 AM  Result Value Ref Range   WBC 11.2 (H) 3.6 - 11.0 K/uL   RBC 3.89 3.80 - 5.20 MIL/uL   Hemoglobin 11.2 (L) 12.0 - 16.0 g/dL   HCT 16.133.2 (L) 09.635.0 - 04.547.0 %   MCV 85.4 80.0 - 100.0 fL   MCH 28.8 26.0 - 34.0 pg   MCHC 33.8 32.0 - 36.0 g/dL   RDW 40.913.5 81.111.5 - 91.414.5 %   Platelets 144 (L) 150 - 440 K/uL  Type and screen [facility]     Status: None   Collection Time: 01/01/17  9:01 AM  Result Value Ref Range   ABO/RH(D) A POS    Antibody Screen NEG    Sample Expiration      01/04/2017 Performed at [facility], [address] [place], [place] [address]   Comprehensive metabolic panel     Status: Abnormal   Collection Time: 01/01/17  9:01 AM  Result Value Ref Range   Sodium 133 (L) 135 - 145 mmol/L   Potassium 3.8 3.5 - 5.1 mmol/L   Chloride 104 101 - 111 mmol/L   CO2 21 (L) 22 - 32 mmol/L   Glucose, Bld 85 65 - 99 mg/dL   BUN 8 6 - 20 mg/dL   Creatinine, Ser 6.210.46 0.44 - 1.00 mg/dL   Calcium 8.8 (L) 8.9 - 10.3 mg/dL   Total Protein 6.4 (L) 6.5 - 8.1 g/dL   Albumin 3.1 (L) 3.5 - 5.0 g/dL   AST 27 15 - 41 U/L   ALT 14 14 - 54 U/L   Alkaline Phosphatase 261 (H) 38 - 126 U/L   Total Bilirubin 0.7 0.3 - 1.2 mg/dL   GFR calc non Af Amer >60 >60 mL/min   GFR calc Af Amer >60 >60 mL/min   Anion gap 8 5 - 15    Protein / creatinine ratio, urine     Status: None   Collection Time: 01/01/17  9:01 AM  Result Value Ref Range   Creatinine, Urine 26 mg/dL   Total Protein, Urine <6 mg/dL   Protein Creatinine Ratio        0.00 - 0.15 mg/mg[Cre]    Assessment:   31 y.o. [id number] 7073w1d IOL history of IUFD  Plan:   1) Labor - continue pitocin, AROM clear - may have epidural when desired  2) Fetus - cat I tracing

## 2017-01-01 NOTE — H&P (Addendum)
Date of Initial H&P: 12/29/2016  History reviewed, patient examined, no change in status, stable for induction of labor

## 2017-01-01 NOTE — Discharge Summary (Signed)
Obstetric Discharge Summary Reason for Admission: induction of labor Prenatal Procedures: none Intrapartum Procedures: spontaneous vaginal delivery Postpartum Procedures: none Complications-Operative and Postpartum: none Hemoglobin  Date Value Ref Range Status  01/02/2017 10.5 (L) 12.0 - 16.0 g/dL Final  16/10/960410/12/2016 54.011.1 11.1 - 15.9 g/dL Final   HCT  Date Value Ref Range Status  01/02/2017 30.8 (L) 35.0 - 47.0 % Final   Hematocrit  Date Value Ref Range Status  10/14/2016 33.8 (L) 34.0 - 46.6 % Final   Physical Exam: General: alert, cooperative and appears stated age 44Lochia: appropriate Uterine Fundus: firm Incision: none DVT Evaluation: No evidence of DVT seen on physical exam.  Discharge Diagnoses: Term Pregnancy-delivered  Discharge Information: Date: 01/03/2017 Activity: pelvic rest Diet: routine Allergies as of 01/03/2017      Reactions   Penicillins Rash      Medication List    TAKE these medications   aspirin EC 81 MG tablet Take by mouth.   PRENATAL VITAMIN PO Take by mouth.      Condition: stable Discharge to: home Follow-up Information    Vena AustriaStaebler, Andreas, MD. Schedule an appointment as soon as possible for a visit on 01/05/2017.   Specialty:  Obstetrics and Gynecology Why:  BP check Contact information: [address] [place] [id number] [phone]          Newborn Data: Live born female  Birth Weight:   APGAR: 9, 9 Newborn Delivery   Birth date/time:  01/01/2017 21:21:00 Delivery type:  Vaginal, Spontaneous   Home with mother.  Circumcision to be done today prior to d/c.  Letitia Libraobert Paul Harris 01/03/2017, 8:49 AM

## 2017-01-02 LAB — CBC
HCT: 30.8 % — ABNORMAL LOW (ref 35.0–47.0)
Hemoglobin: 10.5 g/dL — ABNORMAL LOW (ref 12.0–16.0)
MCH: 29.3 pg (ref 26.0–34.0)
MCHC: 34.1 g/dL (ref 32.0–36.0)
MCV: 86 fL (ref 80.0–100.0)
PLATELETS: 138 10*3/uL — AB (ref 150–440)
RBC: 3.59 MIL/uL — ABNORMAL LOW (ref 3.80–5.20)
RDW: 13.7 % (ref 11.5–14.5)
WBC: 12.4 10*3/uL — AB (ref 3.6–11.0)

## 2017-01-02 NOTE — Progress Notes (Signed)
Post Partum Day 1 Subjective: Doing well, no complaints.  Tolerating regular diet, pain with PO meds, voiding and ambulating without difficulty. Breastfeeding is going well.  No CP SOB F/C N/V or leg pain No HA, change of vision, RUQ/epigastric pain  Objective: BP 114/65 (BP Location: Right Arm)   Pulse 80   Temp 97.7 F (36.5 C) (Oral)   Resp 18   Ht 5\' 6"  (1.676 m)   Wt 170 lb (77.1 kg)   LMP 04/02/2016   SpO2 98%   BMI 27.44 kg/m    Physical Exam:  General: NAD CV: RRR Pulm: nl effort, CTABL Lochia: moderate Uterine Fundus: fundus firm and below umbilicus DVT Evaluation: no cords, ttp LEs   Recent Labs    01/01/17 0901 01/02/17 0419  HGB 11.2* 10.5*  HCT 33.2* 30.8*  WBC 11.2* 12.4*  PLT 144* 138*    Assessment/Plan: 31 y.o. [id number] postpartum day # 1  1. Continue routine postpartum care 2. A+, Rubella Immune, Varicella Immune 3. TDAP status: given 10/28/2016, Flu vaccine given 10/14/2016 4. Breastfeeding/Contraception: IUD 5. Disposition: Discharge to home tomorrow    Tresea MallJane Lorik Guo, CNM

## 2017-01-03 LAB — SYPHILIS: RPR W/REFLEX TO RPR TITER AND TREPONEMAL ANTIBODIES, TRADITIONAL SCREENING AND DIAGNOSIS ALGORITHM: RPR Ser Ql: NONREACTIVE

## 2017-01-03 NOTE — Progress Notes (Signed)
Post Partum Day 2 Subjective: Doing well, no complaints.  Tolerating regular diet, pain with PO meds, voiding and ambulating without difficulty. Breastfeeding is going well.  No CP SOB F/C N/V or leg pain No HA, change of vision, RUQ/epigastric pain  Objective: BP (!) 119/57 (BP Location: Right Arm)   Pulse 63   Temp 98.5 F (36.9 C) (Oral)   Resp 18   Ht 5\' 6"  (1.676 m)   Wt 170 lb (77.1 kg)   LMP 04/02/2016   SpO2 98%   BMI 27.44 kg/m    Physical Exam:  General: NAD CV: RRR Pulm: nl effort, CTABL Lochia: moderate Uterine Fundus: fundus firm and below umbilicus DVT Evaluation: no cords, ttp LEs   Recent Labs    01/01/17 0901 01/02/17 0419  HGB 11.2* 10.5*  HCT 33.2* 30.8*  WBC 11.2* 12.4*  PLT 144* 138*   Assessment/Plan: 32 y.o. [id number] postpartum day # 2 H/o PP Preclampsia w G1,2  1. Continue routine postpartum care 2. A+, Rubella Immune, Varicella Immune 3. TDAP status: given 10/28/2016, Flu vaccine given 10/14/2016 4. Breastfeeding/Contraception: IUD 5. Disposition: Discharge to home   Letitia Libraobert Paul Henchy Mccauley, MD

## 2017-01-03 NOTE — Lactation Note (Signed)
This note was copied from a baby's chart. Lactation Consultation Note  Patient Name: Elizabeth Kramer Today's Date: 01/03/2017   During LC rounds, Mom states baby breastfeeds well. I answered her questions about knowing when baby is well fed and other basic BF education. I left my contact # on board in room and invited her to attend [facility] after discharge.      Maternal Data    Feeding Feeding Type: Breast Fed Length of feed: 15 min  LATCH Score                   Interventions    Lactation Tools Discussed/Used     Consult Status      [name] 01/03/2017, 11:46 AM

## 2017-01-03 NOTE — Discharge Instructions (Signed)
Vaginal Delivery, Care After °Refer to this sheet in the next few weeks. These instructions provide you with information about caring for yourself after vaginal delivery. Your health care provider may also give you more specific instructions. Your treatment has been planned according to current medical practices, but problems sometimes occur. Call your health care provider if you have any problems or questions. °What can I expect after the procedure? °After vaginal delivery, it is common to have: °· Some bleeding from your vagina. °· Soreness in your abdomen, your vagina, and the area of skin between your vaginal opening and your anus (perineum). °· Pelvic cramps. °· Fatigue. ° °Follow these instructions at home: °Medicines °· Take over-the-counter and prescription medicines only as told by your health care provider. °· If you were prescribed an antibiotic medicine, take it as told by your health care provider. Do not stop taking the antibiotic until it is finished. °Driving ° °· Do not drive or operate heavy machinery while taking prescription pain medicine. °· Do not drive for 24 hours if you received a sedative. °Lifestyle °· Do not drink alcohol. This is especially important if you are breastfeeding or taking medicine to relieve pain. °· Do not use tobacco products, including cigarettes, chewing tobacco, or e-cigarettes. If you need help quitting, ask your health care provider. °Eating and drinking °· Drink at least 8 eight-ounce glasses of water every day unless you are told not to by your health care provider. If you choose to breastfeed your baby, you may need to drink more water than this. °· Eat high-fiber foods every day. These foods may help prevent or relieve constipation. High-fiber foods include: °? Whole grain cereals and breads. °? Brown rice. °? Beans. °? Fresh fruits and vegetables. °Activity °· Return to your normal activities as told by your health care provider. Ask your health care provider  what activities are safe for you. °· Rest as much as possible. Try to rest or take a nap when your baby is sleeping. °· Do not lift anything that is heavier than your baby or 10 lb (4.5 kg) until your health care provider says that it is safe. °· Talk with your health care provider about when you can engage in sexual activity. This may depend on your: °? Risk of infection. °? Rate of healing. °? Comfort and desire to engage in sexual activity. °Vaginal Care °· If you have an episiotomy or a vaginal tear, check the area every day for signs of infection. Check for: °? More redness, swelling, or pain. °? More fluid or blood. °? Warmth. °? Pus or a bad smell. °· Do not use tampons or douches until your health care provider says this is safe. °· Watch for any blood clots that may pass from your vagina. These may look like clumps of dark red, brown, or black discharge. °General instructions °· Keep your perineum clean and dry as told by your health care provider. °· Wear loose, comfortable clothing. °· Wipe from front to back when you use the toilet. °· Ask your health care provider if you can shower or take a bath. If you had an episiotomy or a perineal tear during labor and delivery, your health care provider may tell you not to take baths for a certain length of time. °· Wear a bra that supports your breasts and fits you well. °· If possible, have someone help you with household activities and help care for your baby for at least a few days after   you leave the hospital. °· Keep all follow-up visits for you and your baby as told by your health care provider. This is important. °Contact a health care provider if: °· You have: °? Vaginal discharge that has a bad smell. °? Difficulty urinating. °? Pain when urinating. °? A sudden increase or decrease in the frequency of your bowel movements. °? More redness, swelling, or pain around your episiotomy or vaginal tear. °? More fluid or blood coming from your episiotomy or  vaginal tear. °? Pus or a bad smell coming from your episiotomy or vaginal tear. °? A fever. °? A rash. °? Little or no interest in activities you used to enjoy. °? Questions about caring for yourself or your baby. °· Your episiotomy or vaginal tear feels warm to the touch. °· Your episiotomy or vaginal tear is separating or does not appear to be healing. °· Your breasts are painful, hard, or turn red. °· You feel unusually sad or worried. °· You feel nauseous or you vomit. °· You pass large blood clots from your vagina. If you pass a blood clot from your vagina, save it to show to your health care provider. Do not flush blood clots down the toilet without having your health care provider look at them. °· You urinate more than usual. °· You are dizzy or light-headed. °· You have not breastfed at all and you have not had a menstrual period for 12 weeks after delivery. °· You have stopped breastfeeding and you have not had a menstrual period for 12 weeks after you stopped breastfeeding. °Get help right away if: °· You have: °? Pain that does not go away or does not get better with medicine. °? Chest pain. °? Difficulty breathing. °? Blurred vision or spots in your vision. °? Thoughts about hurting yourself or your baby. °· You develop pain in your abdomen or in one of your legs. °· You develop a severe headache. °· You faint. °· You bleed from your vagina so much that you fill two sanitary pads in one hour. °This information is not intended to replace advice given to you by your health care provider. Make sure you discuss any questions you have with your health care provider. °Document Released: 12/18/1999 Document Revised: 06/03/2015 Document Reviewed: 01/04/2015 °Elsevier Interactive Patient Education © 2018 [employer] Inc. ° °

## 2017-01-03 NOTE — Progress Notes (Signed)
Pt discharged with infant.  Discharge instructions, prescriptions and follow up appointment given to and reviewed with pt. Pt verbalized understanding. Escorted out by auxillary. 

## 2017-01-05 ENCOUNTER — Encounter: Payer: Self-pay | Admitting: Obstetrics and Gynecology

## 2017-01-05 ENCOUNTER — Ambulatory Visit (INDEPENDENT_AMBULATORY_CARE_PROVIDER_SITE_OTHER): Payer: 59 | Admitting: Obstetrics and Gynecology

## 2017-01-05 VITALS — BP 106/66 | HR 69 | Ht 66.0 in | Wt 158.0 lb

## 2017-01-05 DIAGNOSIS — O9270 Unspecified disorders of lactation: Secondary | ICD-10-CM

## 2017-01-05 DIAGNOSIS — O09293 Supervision of pregnancy with other poor reproductive or obstetric history, third trimester: Secondary | ICD-10-CM

## 2017-01-05 NOTE — Progress Notes (Signed)
  OBSTETRICS POSTPARTUM CLINIC PROGRESS NOTE  Subjective:     Maryelizabeth RowanJennifer Hargraves is a 32 y.o. [id number] female who presents for a postpartum visit. She is 4 day postpartum following a Term pregnancy and delivery by Vaginal, no problems at delivery.  I have fully reviewed the prenatal and intrapartum course. Anesthesia: epidural.  Postpartum course has been complicated by difficulty with lactation. She feels that her infant is feeding better on her left breast, but not as much on the right. She has not had much milk with the pump. She has tried hand expressing and has not been successful.  Baby is feeding by Breast.  Bleeding: patient has not  resumed menses.  Bowel function is normal. Bladder function is normal.  Patient is not sexually active. Contraception method desired is IUD.   BP normal today. Patient has a history of postpartum preeclampsia.  The following portions of the patient's history were reviewed and updated as appropriate: allergies, current medications, past family history, past medical history, past social history, past surgical history and problem list.  Review of Systems Pertinent items are noted in HPI.  Objective:    BP 106/66   Pulse 69   Ht 5\' 6"  (1.676 m)   Wt 158 lb (71.7 kg)   Breastfeeding? Yes   BMI 25.50 kg/m   General:  alert and no distress   Breasts:  inspection negative, no nipple discharge or bleeding, no masses or nodularity palpable. Breasts engorged with milk. Skin is shiny. No redness, no erythema, no induration.   Lungs: clear to auscultation bilaterally  Heart:  regular rate and rhythm, S1, S2 normal, no murmur, click, rub or gallop  Abdomen: soft, non-tender; bowel sounds normal; no masses,  no organomegaly.     Vulva:  normal  Vagina: normal vagina, no discharge, exudate, lesion, or erythema  Cervix:  no cervical motion tenderness and no lesions  Corpus: normal size, contour, position, consistency, mobility, non-tender  Adnexa:  normal adnexa  and no mass, fullness, tenderness  Rectal Exam: Not performed.          Assessment:  Post Partum Care visit  Plan:  Helped patient express milk from left and right breast. Discussed proper hand expression techniques. Encouraged patient to self express milk. She is over engorged and having difficulty pumping to relieve supply because of her engorgement. BP is normal today.  Continue to monitor for signs of postpartum preeclampsia. Follow up in: 1 week or as needed.   Adelene Idlerhristanna Schuman, MD [facility], [facility] 01/05/2017  1:29 PM

## 2017-01-07 ENCOUNTER — Encounter: Payer: Self-pay | Admitting: *Deleted

## 2017-01-07 ENCOUNTER — Inpatient Hospital Stay
Admit: 2017-01-07 | Discharge: 2017-01-09 | DRG: 776 | Disposition: A | Discharge status: Home / Self Care | Payer: 59 | Attending: Advanced Practice Midwife | Admitting: Advanced Practice Midwife

## 2017-01-07 DIAGNOSIS — O1495 Unspecified pre-eclampsia, complicating the puerperium: Secondary | ICD-10-CM | POA: Diagnosis present

## 2017-01-07 LAB — COMPREHENSIVE METABOLIC PANEL
ALBUMIN: 3.1 g/dL — AB (ref 3.5–5.0)
ALK PHOS: 151 U/L — AB (ref 38–126)
ALT: 20 U/L (ref 14–54)
AST: 25 U/L (ref 15–41)
Anion gap: 8 (ref 5–15)
BUN: 17 mg/dL (ref 6–20)
CALCIUM: 8.3 mg/dL — AB (ref 8.9–10.3)
CO2: 24 mmol/L (ref 22–32)
CREATININE: 0.71 mg/dL (ref 0.44–1.00)
Chloride: 111 mmol/L (ref 101–111)
GFR calc Af Amer: >60 mL/min (ref >60)
GFR calc non Af Amer: >60 mL/min (ref >60)
GLUCOSE: 91 mg/dL (ref 65–99)
Potassium: 4.3 mmol/L (ref 3.5–5.1)
SODIUM: 143 mmol/L (ref 135–145)
Total Bilirubin: 0.2 mg/dL — ABNORMAL LOW (ref 0.3–1.2)
Total Protein: 6.2 g/dL — ABNORMAL LOW (ref 6.5–8.1)

## 2017-01-07 LAB — CBC
HCT: 32.2 % — ABNORMAL LOW (ref 35.0–47.0)
HEMOGLOBIN: 10.8 g/dL — AB (ref 12.0–16.0)
MCH: 29.3 pg (ref 26.0–34.0)
MCHC: 33.6 g/dL (ref 32.0–36.0)
MCV: 87.4 fL (ref 80.0–100.0)
Platelets: 157 10*3/uL (ref 150–440)
RBC: 3.69 MIL/uL — ABNORMAL LOW (ref 3.80–5.20)
RDW: 13.7 % (ref 11.5–14.5)
WBC: 8.8 10*3/uL (ref 3.6–11.0)

## 2017-01-07 LAB — PROTEIN / CREATININE RATIO, URINE
CREATININE, URINE: 129 mg/dL
Protein Creatinine Ratio: 0.16 mg/mg{Cre} — ABNORMAL HIGH (ref 0.00–0.15)
Total Protein, Urine: 20 mg/dL

## 2017-01-07 MED ORDER — SODIUM CHLORIDE 0.9% FLUSH
3.0000 mL | Freq: Two times a day (BID) | INTRAVENOUS | Status: DC
  Administered 2017-01-08: 3 mL via INTRAVENOUS

## 2017-01-07 MED ORDER — HYDRALAZINE HCL 20 MG/ML IJ SOLN: 10.0000 mg | Freq: Once | INTRAMUSCULAR | Status: DC | PRN

## 2017-01-07 MED ORDER — MAGNESIUM SULFATE 50 % IJ SOLN
2.0000 g/h | INTRAVENOUS | Status: DC
  Administered 2017-01-07 – 2017-01-08 (×2): 2 g/h via INTRAVENOUS
  Filled 2017-01-07: qty 80

## 2017-01-07 MED ORDER — LACTATED RINGERS IV SOLN
INTRAVENOUS | Status: DC
  Administered 2017-01-08: 1000 mL via INTRAVENOUS

## 2017-01-07 MED ORDER — MAGNESIUM SULFATE BOLUS VIA INFUSION
6.0000 g | Freq: Once | INTRAVENOUS | Status: AC
  Administered 2017-01-07: 4 g via INTRAVENOUS
  Filled 2017-01-07: qty 500

## 2017-01-07 MED ORDER — ADULT MULTIVITAMIN W/MINERALS CH
1.0000 | ORAL_TABLET | Freq: Every day | ORAL | Status: DC
  Filled 2017-01-07 (×2): qty 1

## 2017-01-07 MED ORDER — LABETALOL HCL 5 MG/ML IV SOLN: 20.0000 mg | INTRAVENOUS | Status: DC | PRN

## 2017-01-07 NOTE — H&P (Signed)
History and Physical  Elizabeth Kramer is an 32 y.o. female.  HPI: Patient called labor and delivery. She has had a history of postpartum pre-eclampsia with two other pregnancies. She has been monitoring her blood pressures at home. Today her blood pressures were significantly more elevated. They had been 100s/60s. Today they were 140-150/80s. She reports a new onset headache. Denies vision changes. Denies RUQ pain.   Past Medical History:  Diagnosis Date  . Preeclampsia in postpartum period     Past Surgical History:  Procedure Laterality Date  . NO PAST SURGERIES      Family History  Problem Relation Age of Onset  . Colon cancer Maternal Uncle   . Lung cancer Maternal Grandfather   . Diabetes Paternal Grandmother     Social History:  reports that  has never smoked. she has never used smokeless tobacco. She reports that she does not drink alcohol or use drugs.  Allergies:  Allergies  Allergen Reactions  . Penicillins Rash    Medications: I have reviewed the patient's current medications.  No results found for this or any previous visit (from the past 48 hour(s)).  No results found.  Review of Systems  Constitutional: Negative for chills, fever, malaise/fatigue and weight loss.  HENT: Negative for congestion, hearing loss and sinus pain.   Eyes: Negative for blurred vision and double vision.  Respiratory: Negative for cough, sputum production, shortness of breath and wheezing.   Cardiovascular: Negative for chest pain, palpitations, orthopnea and leg swelling.  Gastrointestinal: Negative for abdominal pain, constipation, diarrhea, nausea and vomiting.  Genitourinary: Negative for dysuria, flank pain, frequency, hematuria and urgency.  Musculoskeletal: Negative for back pain, falls and joint pain.  Skin: Negative for itching and rash.  Neurological: Negative for dizziness and headaches.  Psychiatric/Behavioral: Negative for depression, substance abuse and suicidal ideas.  The patient is not nervous/anxious.    Blood pressure (!) 159/95, currently breastfeeding. Physical Exam  Nursing note and vitals reviewed. Constitutional: She is oriented to person, place, and time. She appears well-developed and well-nourished.  HENT:  Head: Normocephalic and atraumatic.  Cardiovascular: Normal rate and regular rhythm.  Respiratory: Effort normal and breath sounds normal.  GI: Soft. Bowel sounds are normal.  Musculoskeletal: Normal range of motion.  Neurological: She is alert and oriented to person, place, and time. She displays abnormal reflex.  Hyperreflexic with clonus. 3+  Skin: Skin is warm and dry.  Psychiatric: She has a normal mood and affect. Her behavior is normal. Judgment and thought content normal.    Assessment/Plan: Will admit patient for preeclampsia.  Will start patient on magnesium for neuro protection. Strict I&Os  Anab Vivar R Merel Santoli 01/07/2017, 10:04 PM

## 2017-01-08 ENCOUNTER — Other Ambulatory Visit: Payer: Self-pay

## 2017-01-08 DIAGNOSIS — O1495 Unspecified pre-eclampsia, complicating the puerperium: Secondary | ICD-10-CM

## 2017-01-08 MED ORDER — ACETAMINOPHEN 500 MG PO TABS
ORAL_TABLET | ORAL | Status: AC
  Administered 2017-01-08: 1000 mg via ORAL
  Filled 2017-01-08: qty 2

## 2017-01-08 MED ORDER — MAGNESIUM SULFATE 50 % IJ SOLN
INTRAVENOUS | Status: AC
  Administered 2017-01-08: 2 g/h via INTRAVENOUS
  Filled 2017-01-08: qty 80

## 2017-01-08 MED ORDER — ZOLPIDEM TARTRATE 5 MG PO TABS: 5.0000 mg | ORAL_TABLET | Freq: Once | ORAL | Status: DC

## 2017-01-08 MED ORDER — MAGNESIUM SULFATE 50 % IJ SOLN: 2.0000 g/h | INTRAMUSCULAR | Status: AC

## 2017-01-08 MED ORDER — ACETAMINOPHEN 500 MG PO TABS
1000.0000 mg | ORAL_TABLET | Freq: Three times a day (TID) | ORAL | Status: DC | PRN
  Administered 2017-01-08 – 2017-01-09 (×3): 1000 mg via ORAL
  Filled 2017-01-08 (×2): qty 2

## 2017-01-08 MED ORDER — CALCIUM GLUCONATE 10 % IV SOLN
INTRAVENOUS | Status: AC
Start: 2017-01-08 — End: 2017-01-09
  Filled 2017-01-08: qty 10

## 2017-01-08 MED ORDER — BUTALBITAL-APAP-CAFFEINE 50-325-40 MG PO TABS
1.0000 | ORAL_TABLET | Freq: Four times a day (QID) | ORAL | Status: DC | PRN
  Administered 2017-01-08 (×3): 1 via ORAL
  Filled 2017-01-08 (×3): qty 1

## 2017-01-08 NOTE — Plan of Care (Signed)
Discussed plan of care with patient and spouse who verbalized understanding and agreed to plan.

## 2017-01-08 NOTE — Progress Notes (Signed)
Admit Date: 01/07/2017 Today's Date: 01/08/2017   Subjective:  Patient is doing well. Complains of continued severe frontal headache. No vision changes. Headache has not improved with tylenol or Fioricet. Otherwise she is well. Blood pressures are controlled. She has diuresed a large amount of fluid.   Objective: Temp:  [97.9 F (36.6 C)-98.2 F (36.8 C)] 97.9 F (36.6 C) (01/06 0550) Pulse Rate:  [50-78] 67 (01/06 0849) Resp:  [15-16] 16 (01/06 0550) BP: (103-159)/(58-95) 115/80 (01/06 0849)  Physical Exam:  General: alert, cooperative and no distress Lochia: appropriate Uterine Fundus: firm Incision: none DVT Evaluation: No evidence of DVT seen on physical exam. No significant calf/ankle edema.  Recent Labs    01/07/17 2220  HGB 10.8*  HCT 32.2*    Assessment/Plan: Plan for discharge tomorrow  Will continue IV magnesium for 24 hours. Plan to discontinue this evening if blood pressures are normal.   Continue strict I&Os as well as neuro and blood pressure checks   LOS: 1 day   Christanna R [employer] [facility] 01/08/2017, 9:18 AM

## 2017-01-09 DIAGNOSIS — O1495 Unspecified pre-eclampsia, complicating the puerperium: Secondary | ICD-10-CM

## 2017-01-09 LAB — HIV ANTIBODY (ROUTINE TESTING W REFLEX): HIV SCREEN 4TH GENERATION: NONREACTIVE

## 2017-01-09 NOTE — Progress Notes (Signed)
Pt verbalized all understanding of discharge orders. Discharged via volunteer services.

## 2017-01-09 NOTE — Discharge Summary (Signed)
Physician Discharge Summary  Patient ID: Elizabeth Kramer MRN: [id number] DOB/AGE: 33-May-1987 33 y.o.  Admit date: 01/07/2017 Discharge date: 01/09/2017  Admission Diagnoses: postpartum preeclampsia  Discharge Diagnoses:  Active Problems:   Preeclampsia in postpartum period 32 yo female s/p postpartum day 8, normo tensive  Discharged Condition: good  Hospital Course: The patient was admitted for postpartum preeclampsia with severe headache postpartum day 6. She was given 24 hours IV magnesium for neuro protection. Her headache had not improved by day 1 of admission with either tylenol or fioricet but has resolved by day 2 of admission. She has been normo tensive over the past day.  Consults: None  Significant Diagnostic Studies: labs:   Results for Elizabeth, Kramer (MRN [id number]) as of 01/09/2017 09:34  Ref. Range 01/07/2017 22:07 01/07/2017 22:20  COMPREHENSIVE METABOLIC PANEL Unknown  Rpt (A)  Sodium Latest Ref Range: 135 - 145 mmol/L  143  Potassium Latest Ref Range: 3.5 - 5.1 mmol/L  4.3  Chloride Latest Ref Range: 101 - 111 mmol/L  111  CO2 Latest Ref Range: 22 - 32 mmol/L  24  Glucose Latest Ref Range: 65 - 99 mg/dL  91  BUN Latest Ref Range: 6 - 20 mg/dL  17  Creatinine Latest Ref Range: 0.44 - 1.00 mg/dL  4.69  Calcium Latest Ref Range: 8.9 - 10.3 mg/dL  8.3 (L)  Anion gap Latest Ref Range: 5 - 15   8  Alkaline Phosphatase Latest Ref Range: 38 - 126 U/L  151 (H)  Albumin Latest Ref Range: 3.5 - 5.0 g/dL  3.1 (L)  AST Latest Ref Range: 15 - 41 U/L  25  ALT Latest Ref Range: 14 - 54 U/L  20  Total Protein Latest Ref Range: 6.5 - 8.1 g/dL  6.2 (L)  Total Bilirubin Latest Ref Range: 0.3 - 1.2 mg/dL  0.2 (L)  GFR, Est Non African American Latest Ref Range: >60 mL/min  >60  GFR, Est African American Latest Ref Range: >60 mL/min  >60  WBC Latest Ref Range: 3.6 - 11.0 K/uL  8.8  RBC Latest Ref Range: 3.80 - 5.20 MIL/uL  3.69 (L)  Hemoglobin Latest Ref Range: 12.0 - 16.0 g/dL  62.9  (L)  HCT Latest Ref Range: 35.0 - 47.0 %  32.2 (L)  MCV Latest Ref Range: 80.0 - 100.0 fL  87.4  MCH Latest Ref Range: 26.0 - 34.0 pg  29.3  MCHC Latest Ref Range: 32.0 - 36.0 g/dL  52.8  RDW Latest Ref Range: 11.5 - 14.5 %  13.7  Platelets Latest Ref Range: 150 - 440 K/uL  157  Total Protein, Urine Latest Units: mg/dL 20   Protein Creatinine Ratio Latest Ref Range: 0.00 - 0.15 mg/mgCre 0.16 (H)   Creatinine, Urine Latest Units: mg/dL 413     Treatments: postpartum magnesium, IV anti-hypertensives  Discharge Exam: Blood pressure 112/72, pulse 65, temperature 98.5 F (36.9 C), temperature source Oral, resp. rate 16, SpO2 97 %, currently breastfeeding. General appearance: alert, cooperative, appears stated age and no distress Head: Normocephalic, without obvious abnormality, atraumatic Resp: clear to auscultation bilaterally Breasts: normal appearance, no masses or tenderness Cardio: regular rate and rhythm GI: soft, non-tender; bowel sounds normal; no masses,  no organomegaly and fundus firm below U Extremities: no evidence of DVT Neurologic: Grossly normal  Disposition: 01-Home or Self Care  Discharge Instructions    Activity as tolerated   Complete by:  As directed    Call MD for:  difficulty breathing, headache or visual  disturbances   Complete by:  As directed    Call MD for:  persistant dizziness or light-headedness   Complete by:  As directed    Call MD for:  persistant nausea and vomiting   Complete by:  As directed    Call MD for:  redness, tenderness, or signs of infection (pain, swelling, redness, odor or green/yellow discharge around incision site)   Complete by:  As directed    Call MD for:  severe uncontrolled pain   Complete by:  As directed    Call MD for:  temperature >100.4   Complete by:  As directed    Call MD for temperature >101.0   Diet general   Complete by:  As directed    Sexual acrtivity   Complete by:  As directed    Please refrain from  intercourse until evaluated by your doctor/midwife at your 6 week visit.     Allergies as of 01/09/2017      Reactions   Penicillins Rash      Medication List    TAKE these medications   acetaminophen 500 MG tablet Commonly known as:  TYLENOL Take 1,000 mg by mouth every 6 (six) hours as needed.   aspirin EC 81 MG tablet Take by mouth.   ibuprofen 800 MG tablet Commonly known as:  ADVIL,MOTRIN Take 800 mg by mouth every 8 (eight) hours as needed.   PRENATAL VITAMIN PO Take by mouth.      [facility] Information    [facility] Go to.   Specialty:  Obstetrics and Gynecology Why:  blood pressure check appointment on Friday of this week Contact information: [address] [place] [phone]          Signed: Tresea MallJane Karrin Eisenmenger, CNM 01/09/2017, 9:27 AM

## 2017-01-13 ENCOUNTER — Ambulatory Visit: Payer: 59 | Admitting: Obstetrics and Gynecology

## 2017-02-03 HISTORY — PX: INTRAUTERINE DEVICE (IUD) INSERTION: SHX5877

## 2017-02-12 NOTE — Progress Notes (Signed)
Postpartum Visit  Chief Complaint:  Chief Complaint  Patient presents with  . Postpartum Care    History of Present Illness: Patient is a 32 y.o. [id number] presents for postpartum visit.   Review the Delivery Report for details.  Date of delivery: 01/01/2017 Type of delivery: Vaginal delivery - Vacuum or forceps assisted  no Episiotomy No.  Laceration: no  Pregnancy or labor problems:  no Any problems since the delivery:  Yes postpartum preeclampsia with severe features based on neurologic criteria received 24-hr course of magnesium sulfate  Newborn Details:  SINGLETON :  1. BabyGender female. Birth weight: 7lbs 13oz Maternal Details:  Breast Feeding:  yes Post partum depression/anxiety noted:  no Edinburgh Post-Partum Depression Score:  3  Date of last PAP: 03/04/2015  NIL and HR HPV negative   Review of Systems: Review of Systems  Constitutional: Negative for chills and fever.  HENT: Negative for congestion.   Respiratory: Negative for cough and shortness of breath.   Cardiovascular: Negative for chest pain and palpitations.  Gastrointestinal: Negative for abdominal pain, constipation, diarrhea, heartburn, nausea and vomiting.  Genitourinary: Negative for dysuria, frequency and urgency.  Skin: Negative for itching and rash.  Neurological: Negative for dizziness and headaches.  Endo/Heme/Allergies: Negative for polydipsia.  Psychiatric/Behavioral: Negative for depression.    The following portions of the patient's history were reviewed and updated as appropriate: allergies, current medications, past family history, past medical history, past social history, past surgical history and problem list.  Past Medical History:  Past Medical History:  Diagnosis Date  . Preeclampsia in postpartum period     Past Surgical History:  Past Surgical History:  Procedure Laterality Date  . NO PAST SURGERIES      Family History:  Family History  Problem Relation Age of Onset   . Colon cancer Maternal Uncle   . Lung cancer Maternal Grandfather   . Diabetes Paternal Grandmother     Social History:  Social History   Socioeconomic History  . Marital status: Married    Spouse name: Not on file  . Number of children: Not on file  . Years of education: Not on file  . Highest education level: Not on file  Social Needs  . Financial resource strain: Not on file  . Food insecurity - worry: Not on file  . Food insecurity - inability: Not on file  . Transportation needs - medical: Not on file  . Transportation needs - non-medical: Not on file  Occupational History  . Not on file  Tobacco Use  . Smoking status: Never Smoker  . Smokeless tobacco: Never Used  Substance and Sexual Activity  . Alcohol use: No  . Drug use: No  . Sexual activity: Yes    Birth control/protection: None, IUD    Comment: plans for IUD after delivery  Other Topics Concern  . Not on file  Social History Narrative  . Not on file    Allergies:  Allergies  Allergen Reactions  . Penicillins Rash    Medications: Prior to Admission medications   Medication Sig Start Date End Date Taking? Authorizing Provider  acetaminophen (TYLENOL) 500 MG tablet Take 1,000 mg by mouth every 6 (six) hours as needed.    [provider]  aspirin EC 81 MG tablet Take by mouth.    [provider]  ibuprofen (ADVIL,MOTRIN) 800 MG tablet Take 800 mg by mouth every 8 (eight) hours as needed.    [provider]  Prenatal Vit-Fe  Fumarate-FA (PRENATAL VITAMIN PO) Take by mouth.    [provider]    Physical Exam currently breastfeeding.  General: NAD HEENT: normocephalic, anicteric Pulmonary: No increased work of breathing Abdomen: NABS, soft, non-tender, non-distended.  Umbilicus without lesions.  No hepatomegaly, splenomegaly or masses palpable. No evidence of hernia. Genitourinary:  External: Normal external female genitalia.  Normal urethral meatus, normal    Bartholin's and Skene's glands.    Vagina: Normal vaginal mucosa, no evidence of prolapse.    Cervix: Grossly normal in appearance, no bleeding  Uterus: Non-enlarged, mobile, normal contour.  No CMT  Adnexa: ovaries non-enlarged, no adnexal masses  Rectal: deferred Extremities: no edema, erythema, or tenderness Neurologic: Grossly intact Psychiatric: mood appropriate, affect full  GYNECOLOGY OFFICE PROCEDURE NOTE  Elizabeth Kramer is a 32 y.o. [id number] here for a IUD insertion. No GYN concerns. The patient is currently postpartum.  The indication for her IUD is contraception/cycle control.  Negative predictive value of 99%-100% - < OR = 7 days from the onset of a normal menstrual cycle - has not had sexual intercourse since the start of the last menstrual cycle - has correctly and consistently used a reliable form of contraception - < or = 7 days from an induced abortion - is within 4 weeks postpartum - is exclusively breast feeding (>85% of feeds), amenorrheic, and <6 months postpartum   "[place] Selected Practice Recommendations for Contraceptive Use", August 01, 2014   IUD Insertion Procedure Note Patient identified, informed consent performed, consent signed.   Discussed risks of irregular bleeding, cramping, infection, malpositioning, expulsion or uterine perforation of the IUD (1:1000 placements)  which may require further procedure such as laparoscopy.  IUD while effective at preventing pregnancy do not prevent transmission of sexually transmitted diseases and use of barrier methods for this purpose was discussed. Time out was performed.  Urine pregnancy test negative.  Speculum placed in the vagina.  Cervix visualized.  Cleaned with Betadine x 2.  Grasped anteriorly with a single tooth tenaculum.  Uterus sounded to 7 cm. IUD placed per manufacturer's recommendations.  Strings trimmed to 3 cm. Tenaculum was removed, good hemostasis noted.  Patient tolerated procedure well.   Patient was  given post-procedure instructions.  She was advised to have backup contraception for one week.  Patient was also asked to check IUD strings periodically and follow up in 6 weeks for IUD check.   Assessment: 32 y.o. [id number] presenting for 6 week postpartum visit  Plan: Problem List Items Addressed This Visit      Cardiovascular and Mediastinum   Preeclampsia in postpartum period - Primary    Other Visit Diagnoses    Encounter for postpartum visit       Encounter for IUD insertion           1) Contraception - Education given regarding options for contraception, as well as compatibility with breast feeding if applicable.  Patient plans on IUD for contraception. - Mirena IUD placed today  2)  Pap - ASCCP guidelines and rational discussed.  ASCCP guidelines and rational discussed.  Patient opts for every 3 years screening interval  3) Patient underwent screening for postpartum depression with no signs of depression  4) Return in about 6 weeks (around 03/27/2017) for postpartum visit.   Vena Austria, MD, Evern Core [facility], [facility] 02/13/2017, 9:30 AM

## 2017-02-13 ENCOUNTER — Encounter: Payer: Self-pay | Admitting: Obstetrics and Gynecology

## 2017-02-13 ENCOUNTER — Ambulatory Visit (INDEPENDENT_AMBULATORY_CARE_PROVIDER_SITE_OTHER): Payer: 59 | Admitting: Obstetrics and Gynecology

## 2017-02-13 VITALS — BP 104/62 | HR 72 | Wt 142.0 lb

## 2017-02-13 DIAGNOSIS — Z30430 Encounter for insertion of intrauterine contraceptive device: Secondary | ICD-10-CM | POA: Diagnosis not present

## 2017-02-13 DIAGNOSIS — O1495 Unspecified pre-eclampsia, complicating the puerperium: Secondary | ICD-10-CM

## 2017-03-27 ENCOUNTER — Encounter: Payer: Self-pay | Admitting: Obstetrics and Gynecology

## 2017-03-27 ENCOUNTER — Ambulatory Visit (INDEPENDENT_AMBULATORY_CARE_PROVIDER_SITE_OTHER): Payer: 59 | Admitting: Obstetrics and Gynecology

## 2017-03-27 VITALS — BP 116/82 | HR 62 | Ht 66.0 in | Wt 142.0 lb

## 2017-03-27 DIAGNOSIS — Z30431 Encounter for routine checking of intrauterine contraceptive device: Secondary | ICD-10-CM | POA: Diagnosis not present

## 2017-03-27 NOTE — Progress Notes (Signed)
Obstetrics & Gynecology Office Visit   Chief Complaint:  Chief Complaint  Patient presents with  . Follow-up    IUD check    History of Present Illness: 32 y.o. patient presenting for follow up of Mirena IUD placement 6+ weeks ago.  She denies any complications since her IUD placement.  Still having some occasional spotting.  is able to feel strings.    Review of Systems: Review of Systems  Constitutional: Negative for chills and fever.  HENT: Positive for congestion and sore throat. Negative for ear discharge, ear pain, hearing loss, nosebleeds, sinus pain and tinnitus.   Respiratory: Negative for stridor.   Gastrointestinal: Negative for abdominal pain.  Genitourinary: Negative for dysuria and urgency.  Psychiatric/Behavioral: Negative for depression. The patient is not nervous/anxious and does not have insomnia.     Past Medical History:  Past Medical History:  Diagnosis Date  . Preeclampsia in postpartum period     Past Surgical History:  Past Surgical History:  Procedure Laterality Date  . NO PAST SURGERIES      Gynecologic History: No LMP recorded. (Menstrual status: IUD).  Obstetric History: [id number]  Family History:  Family History  Problem Relation Age of Onset  . Colon cancer Maternal Uncle   . Lung cancer Maternal Grandfather   . Diabetes Paternal Grandmother     Social History:  Social History   Socioeconomic History  . Marital status: Married    Spouse name: Not on file  . Number of children: Not on file  . Years of education: Not on file  . Highest education level: Not on file  Occupational History  . Not on file  Social Needs  . Financial resource strain: Not on file  . Food insecurity:    Worry: Not on file    Inability: Not on file  . Transportation needs:    Medical: Not on file    Non-medical: Not on file  Tobacco Use  . Smoking status: Never Smoker  . Smokeless tobacco: Never Used  Substance and Sexual Activity  . Alcohol  use: No  . Drug use: No  . Sexual activity: Yes    Birth control/protection: None, IUD    Comment: plans for IUD after delivery  Lifestyle  . Physical activity:    Days per week: Not on file    Minutes per session: Not on file  . Stress: Not on file  Relationships  . Social connections:    Talks on phone: Not on file    Gets together: Not on file    Attends religious service: Not on file    Active member of club or organization: Not on file    Attends meetings of clubs or organizations: Not on file    Relationship status: Not on file  . Intimate partner violence:    Fear of current or ex partner: Not on file    Emotionally abused: Not on file    Physically abused: Not on file    Forced sexual activity: Not on file  Other Topics Concern  . Not on file  Social History Narrative  . Not on file    Allergies:  Allergies  Allergen Reactions  . Penicillins Rash    Medications: Prior to Admission medications   Medication Sig Start Date End Date Taking? Authorizing Provider  acetaminophen (TYLENOL) 500 MG tablet Take 1,000 mg by mouth every 6 (six) hours as needed.    [provider]  aspirin EC 81 MG  tablet Take by mouth.    [provider]  ibuprofen (ADVIL,MOTRIN) 800 MG tablet Take 800 mg by mouth every 8 (eight) hours as needed.    [provider]  Prenatal Vit-Fe Fumarate-FA (PRENATAL VITAMIN PO) Take by mouth.    [provider]    Physical Exam Vitals:  Vitals:   03/27/17 0853  BP: 116/82  Pulse: 62   No LMP recorded. (Menstrual status: IUD).  General: NAD HEENT: normocephalic, anicteric Pulmonary: No increased work of breathing Cardiovascular: RRR, distal pulses 2+ Genitourinary:  External: Normal external female genitalia.  Normal urethral meatus, normal  Bartholin's and Skene's glands.    Vagina: Normal vaginal mucosa, no evidence of prolapse.    Cervix: Grossly normal in appearance, no bleeding, IUD strings visualized  2cm  Uterus: Non-enlarged, mobile, normal contour.  No CMT  Adnexa: ovaries non-enlarged, no adnexal masses  Rectal: deferred  Lymphatic: no evidence of inguinal lymphadenopathy Extremities: no edema, erythema, or tenderness Neurologic: Grossly intact Psychiatric: mood appropriate, affect full  Female chaperone present for pelvic and breast  portions of the physical exam  Assessment: 32 y.o. [id number] IUD string check  Plan: Problem List Items Addressed This Visit    None    Visit Diagnoses    IUD check up    -  Primary       1.  The patient was given instructions to check her IUD strings monthly and call with any problems or concerns.  She should call for fevers, chills, abnormal vaginal discharge, pelvic pain, or other complaints.  2.   IUDs while effective at preventing pregnancy do not prevent transmission of sexually transmitted diseases and use of barrier methods for this purpose was discussed.  Low overall incidence of failure with 99.7% efficacy rate in typical use.  The patient has not contraindication to IUD placement.  3.  She will return for a annual exam in 1 year.  All questions answered.  4) A total of 15 minutes were spent in face-to-face contact with the patient during this encounter with over half of that time devoted to counseling and coordination of care.  5) Return in about 1 year (around 03/28/2018) for annual exam.   Vena AustriaAndreas Katana Berthold, MD, Merlinda FrederickFACOG [facility], [facility] 03/27/2017, 9:15 AM

## 2018-06-13 ENCOUNTER — Other Ambulatory Visit: Payer: Self-pay

## 2018-06-13 ENCOUNTER — Other Ambulatory Visit (HOSPITAL_COMMUNITY)
Admission: RE | Admit: 2018-06-13 | Discharge: 2018-06-13 | Disposition: A | Discharge status: Home / Self Care | Payer: 59 | Source: Ambulatory Visit | Attending: Obstetrics and Gynecology | Admitting: Obstetrics and Gynecology

## 2018-06-13 ENCOUNTER — Ambulatory Visit (INDEPENDENT_AMBULATORY_CARE_PROVIDER_SITE_OTHER): Payer: 59 | Admitting: Obstetrics and Gynecology

## 2018-06-13 ENCOUNTER — Encounter: Payer: Self-pay | Admitting: Obstetrics and Gynecology

## 2018-06-13 VITALS — BP 113/72 | HR 77 | Ht 66.0 in | Wt 141.0 lb

## 2018-06-13 DIAGNOSIS — Z124 Encounter for screening for malignant neoplasm of cervix: Secondary | ICD-10-CM | POA: Diagnosis present

## 2018-06-13 DIAGNOSIS — Z01419 Encounter for gynecological examination (general) (routine) without abnormal findings: Secondary | ICD-10-CM

## 2018-06-13 DIAGNOSIS — Z1339 Encounter for screening examination for other mental health and behavioral disorders: Secondary | ICD-10-CM

## 2018-06-13 DIAGNOSIS — Z1331 Encounter for screening for depression: Secondary | ICD-10-CM

## 2018-06-13 DIAGNOSIS — N920 Excessive and frequent menstruation with regular cycle: Secondary | ICD-10-CM

## 2018-06-13 DIAGNOSIS — T8389XA Other specified complication of genitourinary prosthetic devices, implants and grafts, initial encounter: Secondary | ICD-10-CM

## 2018-06-13 MED ORDER — NORGESTIMATE-ETH ESTRADIOL 0.25-35 MG-MCG PO TABS: 1.0000 | ORAL_TABLET | Freq: Every day | ORAL | 0 refills | Status: DC

## 2018-06-13 NOTE — Progress Notes (Signed)
Gynecology Annual Exam  PCP: Patient, No Pcp Per  Chief Complaint  Patient presents with  . Annual Exam    Spotting on IUD    History of Present Illness:  Ms. Elizabeth RowanJennifer Kramer is a 33 y.o. [id number] who LMP was No LMP recorded. (Menstrual status: IUD)., presents today for her annual examination.    With her IUD she has every week.  Sometimes she has a day or two with nothing and some days she has to wear something.  The amount is enough to be bothersome.  She has had her IUD for over a year.   She has had more anxiety.  She has had this anxiety since her third child (~1 year ago).   She is sexually active. She uses the IUD for contraception.  She has no issues with intercourse.   Last Pap: a while  Results were: no abnormalities /neg HPV DNA not done. She has never had an abnormal pap smear.  Hx of STDs: none  There is no FH of breast cancer. There is no FH of ovarian cancer. The patient does not do self-breast exams. She still does breast feed a little.   Tobacco use: The patient denies current or previous tobacco use. Alcohol use: none Exercise: moderately active  The patient wears seatbelts: yes.   The patient reports that domestic violence in her life is absent.   Past Medical History:  Diagnosis Date  . Preeclampsia in postpartum period     Past Surgical History:  Procedure Laterality Date  . INTRAUTERINE DEVICE (IUD) INSERTION  02/2017   Mirena  . NO PAST SURGERIES      Prior to Admission medications   Mirena IUD    Allergies  Allergen Reactions  . Penicillins Rash   Obstetric History: [id number], s/p SVD x 3  Social History   Socioeconomic History  . Marital status: Married    Spouse name: Not on file  . Number of children: Not on file  . Years of education: Not on file  . Highest education level: Not on file  Occupational History  . Not on file  Social Needs  . Financial resource strain: Not on file  . Food insecurity:    Worry: Not on file   Inability: Not on file  . Transportation needs:    Medical: Not on file    Non-medical: Not on file  Tobacco Use  . Smoking status: Never Smoker  . Smokeless tobacco: Never Used  Substance and Sexual Activity  . Alcohol use: No  . Drug use: No  . Sexual activity: Yes    Birth control/protection: None, I.U.D.    Comment: plans for IUD after delivery  Lifestyle  . Physical activity:    Days per week: Not on file    Minutes per session: Not on file  . Stress: Not on file  Relationships  . Social connections:    Talks on phone: Not on file    Gets together: Not on file    Attends religious service: Not on file    Active member of club or organization: Not on file    Attends meetings of clubs or organizations: Not on file    Relationship status: Not on file  . Intimate partner violence:    Fear of current or ex partner: Not on file    Emotionally abused: Not on file    Physically abused: Not on file    Forced sexual activity: Not on file  Other Topics Concern  .  Not on file  Social History Narrative  . Not on file    Family History  Problem Relation Age of Onset  . Colon cancer Maternal Uncle   . Lung cancer Maternal Grandfather   . Diabetes Paternal Grandmother     Review of Systems  Constitutional: Negative.   HENT: Negative.   Eyes: Negative.   Respiratory: Negative.   Cardiovascular: Negative.   Gastrointestinal: Negative.   Genitourinary: Negative.   Musculoskeletal: Negative.   Skin: Negative.   Neurological: Negative.   Psychiatric/Behavioral: Negative.      Physical Exam BP 113/72 (BP Location: Left Arm, Patient Position: Sitting, Cuff Size: Normal)   Pulse 77   Ht 5\' 6"  (1.676 m)   Wt 141 lb (64 kg)   BMI 22.76 kg/m    Physical Exam Constitutional:      General: She is not in acute distress.    Appearance: Normal appearance. She is well-developed.  Genitourinary:     Pelvic exam was performed with patient supine.     Vulva, urethra, bladder  and uterus normal.     No inguinal adenopathy present in the right or left side.    No signs of injury in the vagina.     No vaginal discharge, erythema, tenderness or bleeding.     No cervical motion tenderness, discharge, lesion or polyp.     Uterus is mobile.     Uterus is not enlarged or tender.     No uterine mass detected.    Uterus is anteverted.     No right or left adnexal mass present.     Right adnexa not tender or full.     Left adnexa not tender or full.  HENT:     Head: Normocephalic and atraumatic.  Eyes:     General: No scleral icterus.    Conjunctiva/sclera: Conjunctivae normal.  Neck:     Musculoskeletal: Normal range of motion and neck supple.     Thyroid: No thyromegaly.  Cardiovascular:     Rate and Rhythm: Normal rate and regular rhythm.     Heart sounds: No murmur. No friction rub. No gallop.   Pulmonary:     Effort: Pulmonary effort is normal. No respiratory distress.     Breath sounds: Normal breath sounds. No wheezing or rales.  Abdominal:     General: Bowel sounds are normal. There is no distension.     Palpations: Abdomen is soft. There is no mass.     Tenderness: There is no abdominal tenderness. There is no guarding or rebound.  Musculoskeletal: Normal range of motion.        General: No swelling or tenderness.  Lymphadenopathy:     Cervical: No cervical adenopathy.     Lower Body: No right inguinal adenopathy. No left inguinal adenopathy.  Neurological:     General: No focal deficit present.     Mental Status: She is alert and oriented to person, place, and time.     Cranial Nerves: No cranial nerve deficit.  Skin:    General: Skin is warm and dry.     Findings: No erythema or rash.  Psychiatric:        Mood and Affect: Mood normal.        Behavior: Behavior normal.        Judgment: Judgment normal.   Breast exam deferred as she is still breastfeeding and has no acute complaints today.   Female chaperone present for pelvic and breast   portions  of the physical exam  Results: AUDIT Questionnaire (screen for alcoholism): 0 PHQ-9: 1 GAD-7: 3  Assessment: 33 y.o. [id number] female here for routine annual gynecologic examination  Plan: Problem List Items Addressed This Visit    None    Visit Diagnoses    Women's annual routine gynecological examination    -  Primary   Relevant Medications   norgestimate-ethinyl estradiol (SPRINTEC 28) 0.25-35 MG-MCG tablet   Other Relevant Orders   Cytology - PAP   Screening for depression       Screening for alcoholism       Pap smear for cervical cancer screening       Relevant Orders   Cytology - PAP   Menorrhagia due to intrauterine device (IUD) (HCC)       Relevant Medications   norgestimate-ethinyl estradiol (SPRINTEC 28) 0.25-35 MG-MCG tablet      Screening: -- Blood pressure screen normal -- Weight screening: normal -- Depression screening negative (PHQ-9) -- Nutrition: normal -- cholesterol screening: not due for screening -- osteoporosis screening: not due -- tobacco screening: not using -- alcohol screening: AUDIT questionnaire indicates low-risk usage. -- family history of breast cancer screening: done. not at high risk. -- no evidence of domestic violence or intimate partner violence. -- STD screening: gonorrhea/chlamydia NAAT not collected per patient request. -- pap smear collected per ASCCP guidelines -- HPV vaccination series: has not received - pt refuses   Menorrhagia due to IUD: will try to cycle with Sprintec for a few cycles. Otherwise, may consider removal.  She does have some new anxiety. It is unclear whether this is related to her IUD. She wants to continue to monitor her symptoms at this time.    Thomasene MohairStephen Tasnia Spegal, MD 06/13/2018 5:04 PM

## 2018-06-19 LAB — CYTOLOGY - PAP
Diagnosis: NEGATIVE
HPV: NOT DETECTED

## 2018-08-30 ENCOUNTER — Other Ambulatory Visit: Payer: Self-pay | Admitting: Obstetrics and Gynecology

## 2018-08-30 ENCOUNTER — Other Ambulatory Visit: Payer: Self-pay | Admitting: *Deleted

## 2018-08-30 DIAGNOSIS — Z01419 Encounter for gynecological examination (general) (routine) without abnormal findings: Secondary | ICD-10-CM

## 2018-08-30 DIAGNOSIS — Z20822 Contact with and (suspected) exposure to covid-19: Secondary | ICD-10-CM

## 2018-08-30 DIAGNOSIS — N920 Excessive and frequent menstruation with regular cycle: Secondary | ICD-10-CM

## 2018-08-30 NOTE — Telephone Encounter (Signed)
Advise

## 2018-08-31 LAB — NOVEL CORONAVIRUS, NAA: SARS-CoV-2, NAA: DETECTED — AB

## 2018-10-03 ENCOUNTER — Other Ambulatory Visit: Payer: Self-pay

## 2018-10-03 DIAGNOSIS — Z01419 Encounter for gynecological examination (general) (routine) without abnormal findings: Secondary | ICD-10-CM

## 2018-10-03 DIAGNOSIS — N920 Excessive and frequent menstruation with regular cycle: Secondary | ICD-10-CM

## 2018-10-03 MED ORDER — NORGESTIMATE-ETH ESTRADIOL 0.25-35 MG-MCG PO TABS: 1.0000 | ORAL_TABLET | Freq: Every day | ORAL | 0 refills | Status: DC

## 2018-11-15 ENCOUNTER — Encounter: Payer: Self-pay | Admitting: Obstetrics and Gynecology

## 2018-11-15 ENCOUNTER — Other Ambulatory Visit: Payer: Self-pay

## 2018-11-15 ENCOUNTER — Ambulatory Visit (INDEPENDENT_AMBULATORY_CARE_PROVIDER_SITE_OTHER): Payer: 59 | Admitting: Obstetrics and Gynecology

## 2018-11-15 VITALS — BP 111/73 | HR 62 | Ht 66.0 in | Wt 141.0 lb

## 2018-11-15 DIAGNOSIS — T8332XA Displacement of intrauterine contraceptive device, initial encounter: Secondary | ICD-10-CM | POA: Diagnosis not present

## 2018-11-15 DIAGNOSIS — Z30432 Encounter for removal of intrauterine contraceptive device: Secondary | ICD-10-CM | POA: Diagnosis not present

## 2018-11-15 DIAGNOSIS — N920 Excessive and frequent menstruation with regular cycle: Secondary | ICD-10-CM | POA: Diagnosis not present

## 2018-11-15 DIAGNOSIS — Z3041 Encounter for surveillance of contraceptive pills: Secondary | ICD-10-CM

## 2018-11-15 DIAGNOSIS — O9270 Unspecified disorders of lactation: Secondary | ICD-10-CM

## 2018-11-15 MED ORDER — NORGESTIMATE-ETH ESTRADIOL 0.25-35 MG-MCG PO TABS: 1.0000 | ORAL_TABLET | Freq: Every day | ORAL | 2 refills | Status: DC

## 2018-11-15 NOTE — Progress Notes (Signed)
Obstetrics & Gynecology Office Visit   Chief Complaint  Patient presents with  . Vaginal Bleeding    Bleeding w/IUD   History of Present Illness: 33 y.o. [id number] female who presents for follow up for vaginal bleeding breakthrough with the IUD. She was cycled on combined oral contraceptive pills.  She still had occasional spotting even with the birth control.  After stopping the birth control things got worse. So, she went back on the birth control nothing changed and even got worse.  The past three days have been OK. She has not had cramping during these episodes.  Having intercourse would provoke bleeding either during or by the next morning.  She has no abdominal pain.  She denies fevers and chills.  She does not check her own strings, as least not recently.    Past Medical History:  Diagnosis Date  . Preeclampsia in postpartum period    Past Surgical History:  Procedure Laterality Date  . INTRAUTERINE DEVICE (IUD) INSERTION  02/2017   Mirena  . NO PAST SURGERIES     Gynecologic History: No LMP recorded. (Menstrual status: IUD).  Obstetric History: [id number]  Family History  Problem Relation Age of Onset  . Colon cancer Maternal Uncle   . Lung cancer Maternal Grandfather   . Diabetes Paternal Grandmother     Social History   Socioeconomic History  . Marital status: Married    Spouse name: Not on file  . Number of children: Not on file  . Years of education: Not on file  . Highest education level: Not on file  Occupational History  . Not on file  Social Needs  . Financial resource strain: Not on file  . Food insecurity    Worry: Not on file    Inability: Not on file  . Transportation needs    Medical: Not on file    Non-medical: Not on file  Tobacco Use  . Smoking status: Never Smoker  . Smokeless tobacco: Never Used  Substance and Sexual Activity  . Alcohol use: No  . Drug use: No  . Sexual activity: Yes    Birth control/protection: None, I.U.D.    Comment:  plans for IUD after delivery  Lifestyle  . Physical activity    Days per week: Not on file    Minutes per session: Not on file  . Stress: Not on file  Relationships  . Social Musician on phone: Not on file    Gets together: Not on file    Attends religious service: Not on file    Active member of club or organization: Not on file    Attends meetings of clubs or organizations: Not on file    Relationship status: Not on file  . Intimate partner violence    Fear of current or ex partner: Not on file    Emotionally abused: Not on file    Physically abused: Not on file    Forced sexual activity: Not on file  Other Topics Concern  . Not on file  Social History Narrative  . Not on file    Allergies  Allergen Reactions  . Penicillins Rash    Prior to Admission medications   Medication Sig Start Date End Date Taking? Authorizing Provider  levonorgestrel (MIRENA) 20 MCG/24HR IUD 1 each by Intrauterine route once.   Yes [provider]  norgestimate-ethinyl estradiol (SPRINTEC 28) 0.25-35 MG-MCG tablet Take 1 tablet by mouth daily. 10/03/18  Yes Conard Novak, MD  Review of Systems  Constitutional: Negative.   HENT: Negative.   Eyes: Negative.   Respiratory: Negative.   Cardiovascular: Negative.   Gastrointestinal: Negative.   Genitourinary: Negative.   Musculoskeletal: Negative.   Skin: Negative.   Neurological: Negative.   Psychiatric/Behavioral: Negative.      Physical Exam BP 111/73 (BP Location: Left Arm, Patient Position: Sitting, Cuff Size: Normal)   Pulse 62   Ht 5\' 6"  (1.676 m)   Wt 141 lb (64 kg)   BMI 22.76 kg/m  No LMP recorded. (Menstrual status: IUD). Physical Exam Constitutional:      General: She is not in acute distress.    Appearance: Normal appearance.  Genitourinary:     Pelvic exam was performed with patient in the lithotomy position.     Vulva, inguinal canal, urethra, bladder, vagina, cervix and uterus normal.      No posterior fourchette tenderness, injury, rash or lesion present.     IUD strings visualized.     Uterus is retroverted.  HENT:     Head: Normocephalic and atraumatic.  Eyes:     General: No scleral icterus.    Conjunctiva/sclera: Conjunctivae normal.  Neurological:     General: No focal deficit present.     Mental Status: She is alert and oriented to person, place, and time.     Cranial Nerves: No cranial nerve deficit.  Psychiatric:        Mood and Affect: Mood normal.        Behavior: Behavior normal.        Judgment: Judgment normal.    Bedside ultrasound: Her uterus is retroverted.  Her bladder is moderately distended, making visualization a little easier.  There is a loop of bowel containing gas that is partially shadowing the uterus.  The IUD appears to be in the lower uterine segment. The fundus of the uterus is visualized, along with the endometrial cavity.  The IUD appears to extend only a couple of centimeters into the cervix.  No other lesions apparent to account for her bleeding.   IUD Removal  Patient identified, informed consent performed, consent signed.  Patient was in the dorsal lithotomy position, normal external genitalia was noted.  A speculum was placed in the patient's vagina, normal discharge was noted, no lesions. The cervix was visualized, no lesions, no abnormal discharge.  The strings of the IUD were grasped and pulled using ring forceps. The IUD was removed in its entirety. Patient tolerated the procedure well.  The string was grasped the edge of the external cervical os. From the location of where the string was grasped to the end (ring) of the IUD was approximately 2 cm in length.  This confirms the malposition of the IUD.  Patient will use combined OCPs for contraception.  Routine preventative health maintenance measures emphasized.  Female chaperone present for pelvic and breast  portions of the physical exam  Assessment: 33 y.o. [id number] female here for   1. Malpositioned intrauterine device (IUD), initial encounter   2. Encounter for IUD removal   4. Menorrhagia due to intrauterine device (IUD) (HCC)   5. Encounter for surveillance of contraceptive pills      Plan: Problem List Items Addressed This Visit    None    Visit Diagnoses    Malpositioned intrauterine device (IUD), initial encounter    -  Primary   Encounter for IUD removal       Menorrhagia due to intrauterine device (IUD) (HCC)  Encounter for surveillance of contraceptive pills       Relevant Medications   norgestimate-ethinyl estradiol (SPRINTEC 28) 0.25-35 MG-MCG tablet     Prior to removal of the IUD we discussed that I was about 80 or so percent sure that her IUD was malpositioned.  I offered her a transvaginal ultrasound that would need to be scheduled to verify the malposition of the IUD.  The importance of this information to her was that if the IUD was out of position it would be possible to have a new IUD placed that would be in the correct position and she should have a reasonable expectation that this problem would not happen again.  On the other hand, if the IUD or in its correct position, and she still had this level of bleeding, another IUD would not fix the problem.  I am fairly certain the IUD was out of position.  Upon removal of the IUD the distance from where the IUD the string was grasped to the ring and of the IUD was only about 2 cm.  I would expect this distance to be 4 5 cm, given that the average cervical length is about 4 cm and the IUD should be beyond that point.  She is unsure how she would like to proceed with long-term contraception today.  She has been taking the birth control pills and is near the end of the pack.  I prescribed her more birth control pills that she can use until she makes a long-term decision regarding her birth control.  She will call to schedule a placement of another IUD, if she chooses to go that route.  Prentice Docker,  MD 11/15/2018 5:23 PM

## 2019-07-31 ENCOUNTER — Ambulatory Visit (INDEPENDENT_AMBULATORY_CARE_PROVIDER_SITE_OTHER): Payer: 59 | Admitting: Advanced Practice Midwife

## 2019-07-31 ENCOUNTER — Other Ambulatory Visit: Payer: Self-pay

## 2019-07-31 ENCOUNTER — Encounter: Payer: Self-pay | Admitting: Advanced Practice Midwife

## 2019-07-31 VITALS — BP 125/78 | HR 79 | Ht 66.0 in | Wt 137.0 lb

## 2019-07-31 DIAGNOSIS — Z0000 Encounter for general adult medical examination without abnormal findings: Secondary | ICD-10-CM | POA: Diagnosis not present

## 2019-07-31 DIAGNOSIS — Z3041 Encounter for surveillance of contraceptive pills: Secondary | ICD-10-CM

## 2019-07-31 MED ORDER — NORGESTIMATE-ETH ESTRADIOL 0.25-35 MG-MCG PO TABS: 1.0000 | ORAL_TABLET | Freq: Every day | ORAL | 4 refills | Status: DC

## 2019-07-31 NOTE — Progress Notes (Signed)
Gynecology Annual Exam   PCP: Patient, No Pcp Per  Chief Complaint:  Chief Complaint  Patient presents with  . Gynecologic Exam    left breast pain, stopped breastfeeding in 01/2019    History of Present Illness: Patient is a 34 y.o. [id number] presents for annual exam. The patient has no gyn complaints today. She mentions recent breast pain in the past week which is better today. She denies change in bra, trauma to the area, change in exercise routine. She stopped breastfeeding in January of this year. She has not noticed any lumps.   LMP: Patient's last menstrual period was 06/25/2019. Average Interval: regular, 28 days Duration of flow: 5 days Heavy Menses: no Clots: no Intermenstrual Bleeding: no Postcoital Bleeding: no Dysmenorrhea: no  The patient is sexually active. She currently uses OCP (estrogen/progesterone) for contraception. She denies dyspareunia.  The patient does perform self breast exams.  There is no notable family history of breast or ovarian cancer in her family.  The patient wears seatbelts: yes.   The patient has regular exercise: she is active with her children and uses exercise equipment some days and walks regularly. She admits healthy diet, adequate hydration and adequate sleep. She drinks about 10 oz of diet coke daily.    The patient denies current symptoms of depression.    Review of Systems: Review of Systems  Constitutional: Negative for chills and fever.  HENT: Negative for congestion, ear discharge, ear pain, hearing loss, sinus pain and sore throat.   Eyes: Negative for blurred vision and double vision.  Respiratory: Negative for cough, shortness of breath and wheezing.   Cardiovascular: Negative for chest pain, palpitations and leg swelling.  Gastrointestinal: Negative for abdominal pain, blood in stool, constipation, diarrhea, heartburn, melena, nausea and vomiting.  Genitourinary: Negative for dysuria, flank pain, frequency, hematuria and  urgency.  Musculoskeletal: Negative for back pain, joint pain and myalgias.  Skin: Negative for itching and rash.  Neurological: Negative for dizziness, tingling, tremors, sensory change, speech change, focal weakness, seizures, loss of consciousness, weakness and headaches.  Endo/Heme/Allergies: Negative for environmental allergies. Does not bruise/bleed easily.  Psychiatric/Behavioral: Negative for depression, hallucinations, memory loss, substance abuse and suicidal ideas. The patient is not nervous/anxious and does not have insomnia.   Breast: positive for left breast pain  Past Medical History:  Patient Active Problem List   Diagnosis Date Noted  . History of IUFD 10/14/2016    q4 week growth scans     Past Surgical History:  Past Surgical History:  Procedure Laterality Date  . INTRAUTERINE DEVICE (IUD) INSERTION  02/2017   Mirena  . NO PAST SURGERIES      Gynecologic History:  Patient's last menstrual period was 06/25/2019. Contraception: OCP (estrogen/progesterone) Last Pap: 1 year ago Results were: no abnormalities   Obstetric History: [id number]  Family History:  Family History  Problem Relation Age of Onset  . Colon cancer Maternal Uncle   . Lung cancer Maternal Grandfather   . Diabetes Paternal Grandmother     Social History:  Social History   Socioeconomic History  . Marital status: Married    Spouse name: Not on file  . Number of children: Not on file  . Years of education: Not on file  . Highest education level: Not on file  Occupational History  . Not on file  Tobacco Use  . Smoking status: Never Smoker  . Smokeless tobacco: Never Used  Vaping Use  . Vaping Use: Never used  Substance and Sexual Activity  . Alcohol use: No  . Drug use: No  . Sexual activity: Yes    Birth control/protection: None, Pill    Comment: plans for IUD after delivery  Other Topics Concern  . Not on file  Social History Narrative  . Not on file   Social Determinants  of Health   Financial Resource Strain:   . Difficulty of Paying Living Expenses:   Food Insecurity:   . Worried About Programme researcher, broadcasting/film/video in the Last Year:   . Barista in the Last Year:   Transportation Needs:   . Freight forwarder (Medical):   Marland Kitchen Lack of Transportation (Non-Medical):   Physical Activity:   . Days of Exercise per Week:   . Minutes of Exercise per Session:   Stress:   . Feeling of Stress :   Social Connections:   . Frequency of Communication with Friends and Family:   . Frequency of Social Gatherings with Friends and Family:   . Attends Religious Services:   . Active Member of Clubs or Organizations:   . Attends Banker Meetings:   Marland Kitchen Marital Status:   Intimate Partner Violence:   . Fear of Current or Ex-Partner:   . Emotionally Abused:   Marland Kitchen Physically Abused:   . Sexually Abused:     Allergies:  Allergies  Allergen Reactions  . Penicillins Rash    Medications: Prior to Admission medications   Medication Sig Start Date End Date Taking? Authorizing Provider  norgestimate-ethinyl estradiol (SPRINTEC 28) 0.25-35 MG-MCG tablet Take 1 tablet by mouth daily. 07/31/19   Tresea Mall, CNM    Physical Exam Vitals: Blood pressure 125/78, pulse 79, height 5\' 6"  (1.676 m), weight 137 lb (62.1 kg), last menstrual period 06/25/2019, currently breastfeeding.  General: NAD HEENT: normocephalic, anicteric Thyroid: no enlargement, no palpable nodules Pulmonary: No increased work of breathing, CTAB Cardiovascular: RRR, distal pulses 2+ Breast: Breast symmetrical, no tenderness to palpation, no palpable nodules or masses, no skin or nipple retraction present, no nipple discharge.  No axillary or supraclavicular lymphadenopathy. Abdomen: NABS, soft, non-tender, non-distended.  Umbilicus without lesions.  No hepatomegaly, splenomegaly or masses palpable. No evidence of hernia  Genitourinary: deferred for no concerns/PAP interval Extremities: no  edema, erythema, or tenderness Neurologic: Grossly intact Psychiatric: mood appropriate, affect full    Assessment: 34 y.o. 20 routine annual exam  Plan: Problem List Items Addressed This Visit    None    Visit Diagnoses    Well woman exam without gynecological exam    -  Primary   Encounter for surveillance of contraceptive pills       Relevant Medications   norgestimate-ethinyl estradiol (SPRINTEC 28) 0.25-35 MG-MCG tablet      1) STI screening  was offered and declined  2)  ASCCP guidelines and rationale discussed.  Patient opts for every 3 years screening interval  3) Contraception - the patient is currently using  OCP (estrogen/progesterone).  She is happy with her current form of contraception and plans to continue  4) Routine healthcare maintenance including cholesterol, diabetes screening discussed Declines   5) Consider discontinuing/decreasing diet soda intake. Drink fruit flavored waters, black, green or herb tea- unsweetened or lightly sweetened with cane sugar or honey  6) Return in about 1 year (around 07/30/2020) for annual established gyn.   08/01/2020, CNM [facility] 07/31/2019, 9:57 AM

## 2020-06-19 ENCOUNTER — Telehealth: Payer: 59

## 2020-06-19 NOTE — Telephone Encounter (Signed)
Spoke w/patient. [facility] can [facility]. Advised [facility] [place] and request refill. If it is flagged too soon for refill, pharmacy may can do an override d/t out of state. She will contact us back if she needs further assistance.

## 2020-06-19 NOTE — Telephone Encounter (Signed)
Out of State forgot Birth Control. [facility] [place], [place] [phone]. TJ#[phone]

## 2020-09-25 ENCOUNTER — Other Ambulatory Visit: Payer: Self-pay

## 2020-09-25 DIAGNOSIS — Z3041 Encounter for surveillance of contraceptive pills: Secondary | ICD-10-CM

## 2020-09-25 MED ORDER — NORGESTIMATE-ETH ESTRADIOL 0.25-35 MG-MCG PO TABS: 1.0000 | ORAL_TABLET | Freq: Every day | ORAL | 0 refills | Status: DC

## 2020-09-25 NOTE — Telephone Encounter (Signed)
Pt calling for refill of bc; couldn't get into [facility]Chart[facility].  [phone] Pt aware refill eRx'd.  Number given for help c MyChart.

## 2020-10-14 ENCOUNTER — Ambulatory Visit (INDEPENDENT_AMBULATORY_CARE_PROVIDER_SITE_OTHER): Payer: 59 | Admitting: Advanced Practice Midwife

## 2020-10-14 ENCOUNTER — Other Ambulatory Visit: Payer: Self-pay

## 2020-10-14 ENCOUNTER — Encounter: Payer: Self-pay | Admitting: Advanced Practice Midwife

## 2020-10-14 VITALS — BP 100/60 | Ht 66.0 in | Wt 144.0 lb

## 2020-10-14 DIAGNOSIS — Z23 Encounter for immunization: Secondary | ICD-10-CM

## 2020-10-14 DIAGNOSIS — Z3041 Encounter for surveillance of contraceptive pills: Secondary | ICD-10-CM | POA: Diagnosis not present

## 2020-10-14 DIAGNOSIS — Z0000 Encounter for general adult medical examination without abnormal findings: Secondary | ICD-10-CM

## 2020-10-14 MED ORDER — NORGESTIMATE-ETH ESTRADIOL 0.25-35 MG-MCG PO TABS: 1.0000 | ORAL_TABLET | Freq: Every day | ORAL | 4 refills | Status: DC

## 2020-10-14 NOTE — Progress Notes (Addendum)
Gynecology Annual Exam   PCP: Patient, No Pcp Per (Inactive)  Chief Complaint:  Chief Complaint  Patient presents with   Annual Exam    History of Present Illness: Patient is a 35 y.o. [id number] presents for annual exam. The patient has no complaints today.   LMP: Patient's last menstrual period was 09/16/2020. Average Interval: regular, 28 days Duration of flow:  5-6  days Heavy Menses: no Clots: no Intermenstrual Bleeding: no Postcoital Bleeding: no Dysmenorrhea: no  The patient is sexually active. She currently uses OCP (estrogen/progesterone) for contraception. She denies dyspareunia.  The patient does perform self breast exams.  There is no notable family history of breast or ovarian cancer in her family.  The patient wears seatbelts: yes.   The patient has regular exercise: she walks regularly and is active with her children, she admits a healthy diet with a weakness for sweets/carbs. She admits adequate hydration and sleep.    The patient denies current symptoms of depression.    Review of Systems: Review of Systems  Constitutional:  Negative for chills and fever.  HENT:  Negative for congestion, ear discharge, ear pain, hearing loss, sinus pain and sore throat.   Eyes:  Negative for blurred vision and double vision.  Respiratory:  Negative for cough, shortness of breath and wheezing.   Cardiovascular:  Negative for chest pain, palpitations and leg swelling.  Gastrointestinal:  Negative for abdominal pain, blood in stool, constipation, diarrhea, heartburn, melena, nausea and vomiting.  Genitourinary:  Negative for dysuria, flank pain, frequency, hematuria and urgency.  Musculoskeletal:  Negative for back pain, joint pain and myalgias.  Skin:  Negative for itching and rash.  Neurological:  Negative for dizziness, tingling, tremors, sensory change, speech change, focal weakness, seizures, loss of consciousness, weakness and headaches.  Endo/Heme/Allergies:  Negative  for environmental allergies. Does not bruise/bleed easily.  Psychiatric/Behavioral:  Negative for depression, hallucinations, memory loss, substance abuse and suicidal ideas. The patient is not nervous/anxious and does not have insomnia.    Past Medical History:  Patient Active Problem List   Diagnosis Date Noted   History of IUFD 10/14/2016    q4 week growth scans     Past Surgical History:  Past Surgical History:  Procedure Laterality Date   INTRAUTERINE DEVICE (IUD) INSERTION  02/2017   Mirena   NO PAST SURGERIES      Gynecologic History:  Patient's last menstrual period was 09/16/2020. Contraception: OCP (estrogen/progesterone) Last Pap: 2020 Results were: no abnormalities   Obstetric History: [id number]  Family History:  Family History  Problem Relation Age of Onset   Colon cancer Maternal Uncle    Lung cancer Maternal Grandfather    Diabetes Paternal Grandmother     Social History:  Social History   Socioeconomic History   Marital status: Married    Spouse name: Not on file   Number of children: Not on file   Years of education: Not on file   Highest education level: Not on file  Occupational History   Not on file  Tobacco Use   Smoking status: Never   Smokeless tobacco: Never  Vaping Use   Vaping Use: Never used  Substance and Sexual Activity   Alcohol use: No   Drug use: No   Sexual activity: Yes    Birth control/protection: None, Pill    Comment: plans for IUD after delivery  Other Topics Concern   Not on file  Social History Narrative   Not on file  Social Determinants of Health   Financial Resource Strain: Not on file  Food Insecurity: Not on file  Transportation Needs: Not on file  Physical Activity: Not on file  Stress: Not on file  Social Connections: Not on file  Intimate Partner Violence: Not on file    Allergies:  Allergies  Allergen Reactions   Penicillins Rash    Medications: Prior to Admission medications   Medication  Sig Start Date End Date Taking? Authorizing Provider  norgestimate-ethinyl estradiol (SPRINTEC 28) 0.25-35 MG-MCG tablet Take 1 tablet by mouth daily. 10/14/20   Tresea Mall, CNM    Physical Exam Vitals: Blood pressure 100/60, height 5\' 6"  (1.676 m), weight 144 lb (65.3 kg), last menstrual period 09/16/2020, currently breastfeeding.  General: NAD HEENT: normocephalic, anicteric Thyroid: no enlargement, no palpable nodules Pulmonary: No increased work of breathing, CTAB Cardiovascular: RRR, distal pulses 2+ Breast: Breast symmetrical, no tenderness, no palpable nodules or masses, no skin or nipple retraction present, no nipple discharge.  No axillary or supraclavicular lymphadenopathy. Abdomen: NABS, soft, non-tender, non-distended.  Umbilicus without lesions.  No hepatomegaly, splenomegaly or masses palpable. No evidence of hernia  Genitourinary: deferred for no concerns/PAP interval Extremities: no edema, erythema, or tenderness Neurologic: Grossly intact Psychiatric: mood appropriate, affect full   Assessment: 35 y.o. 31 routine annual exam  Plan: Problem List Items Addressed This Visit   None Visit Diagnoses     Well woman exam without gynecological exam    -  Primary   Need for immunization against influenza       Relevant Orders   Flu Vaccine QUAD 55mo+IM (Fluarix, Fluzone & Alfiuria Quad PF) (Completed)   Encounter for surveillance of contraceptive pills       Relevant Medications   norgestimate-ethinyl estradiol (SPRINTEC 28) 0.25-35 MG-MCG tablet       1) STI screening  was offered and declined  2)  ASCCP guidelines and rationale discussed.  Patient opts for every 3 years screening interval  3) Contraception - the patient is currently using  OCP (estrogen/progesterone).  She is happy with her current form of contraception and plans to continue  4) Routine healthcare maintenance including cholesterol, diabetes screening discussed Declines  5) Return in  about 1 year (around 10/14/2021) for annual established gyn.   12/14/2021, CNM [facility] 10/15/2020, 11:58 AM

## 2020-11-18 ENCOUNTER — Telehealth: Payer: Self-pay | Admitting: Internal Medicine

## 2020-11-18 DIAGNOSIS — Z524 Kidney donor: Secondary | ICD-10-CM

## 2020-11-20 NOTE — Telephone Encounter (Signed)
Recip seen this week, and cleared to move forward in eval. Orders in for ph 1  Rinaldo Ratel, RN

## 2020-12-02 NOTE — Addendum Note (Signed)
Addended by: Evangeline Dakin on: 12/02/2020 08:31 AM     Modules accepted: Orders

## 2020-12-02 NOTE — Telephone Encounter (Signed)
Cancelled phase one orders.  Bryn Perkin, BSN, RN,CCTC  Living Donor Coordinator  Telephone: [phone]

## 2021-11-22 ENCOUNTER — Other Ambulatory Visit: Payer: Self-pay | Admitting: Advanced Practice Midwife

## 2021-11-22 DIAGNOSIS — Z3041 Encounter for surveillance of contraceptive pills: Secondary | ICD-10-CM

## 2021-12-12 ENCOUNTER — Other Ambulatory Visit: Payer: Self-pay | Admitting: Advanced Practice Midwife

## 2021-12-12 DIAGNOSIS — Z3041 Encounter for surveillance of contraceptive pills: Secondary | ICD-10-CM

## 2022-01-24 ENCOUNTER — Telehealth: Payer: Self-pay

## 2022-01-24 DIAGNOSIS — Z3041 Encounter for surveillance of contraceptive pills: Secondary | ICD-10-CM

## 2022-01-24 MED ORDER — NORGESTIMATE-ETH ESTRADIOL 0.25-35 MG-MCG PO TABS: 1.0000 | ORAL_TABLET | Freq: Every day | ORAL | 0 refills | Status: DC

## 2022-01-24 NOTE — Telephone Encounter (Signed)
Elizabeth Kramer called triage asking for 1 refill of her birth control to hold her over until her annual on 02/21/22 with Opal Sidles.  One refill was sent in.  Called patient and she understood

## 2022-02-16 ENCOUNTER — Other Ambulatory Visit: Payer: Self-pay | Admitting: Advanced Practice Midwife

## 2022-02-16 DIAGNOSIS — Z3041 Encounter for surveillance of contraceptive pills: Secondary | ICD-10-CM

## 2022-02-21 ENCOUNTER — Ambulatory Visit: Payer: Self-pay | Admitting: Advanced Practice Midwife

## 2022-02-21 ENCOUNTER — Other Ambulatory Visit: Payer: Self-pay

## 2022-02-21 DIAGNOSIS — Z3041 Encounter for surveillance of contraceptive pills: Secondary | ICD-10-CM

## 2022-02-21 MED ORDER — NORGESTIMATE-ETH ESTRADIOL 0.25-35 MG-MCG PO TABS: 1.0000 | ORAL_TABLET | Freq: Every day | ORAL | 1 refills | Status: DC

## 2022-02-21 NOTE — Telephone Encounter (Signed)
Patient came into office requesting refill on birthcontrol, patient states that she will run out prior to her appointment on 03/21/22. KW

## 2022-03-14 ENCOUNTER — Other Ambulatory Visit: Payer: Self-pay | Admitting: Advanced Practice Midwife

## 2022-03-14 DIAGNOSIS — Z3041 Encounter for surveillance of contraceptive pills: Secondary | ICD-10-CM

## 2022-03-21 ENCOUNTER — Ambulatory Visit: Payer: Self-pay | Admitting: Advanced Practice Midwife

## 2022-04-13 ENCOUNTER — Ambulatory Visit: Payer: BC Managed Care – PPO | Admitting: Physician Assistant

## 2022-04-17 ENCOUNTER — Other Ambulatory Visit: Payer: Self-pay | Admitting: Advanced Practice Midwife

## 2022-04-17 DIAGNOSIS — Z3041 Encounter for surveillance of contraceptive pills: Secondary | ICD-10-CM

## 2022-05-03 ENCOUNTER — Ambulatory Visit: Payer: Self-pay | Admitting: Advanced Practice Midwife

## 2022-05-27 ENCOUNTER — Other Ambulatory Visit: Payer: Self-pay | Admitting: Advanced Practice Midwife

## 2022-05-27 DIAGNOSIS — Z3041 Encounter for surveillance of contraceptive pills: Secondary | ICD-10-CM

## 2022-06-01 ENCOUNTER — Ambulatory Visit (INDEPENDENT_AMBULATORY_CARE_PROVIDER_SITE_OTHER): Payer: Commercial Managed Care - PPO | Admitting: Physician Assistant

## 2022-06-01 ENCOUNTER — Encounter: Payer: Self-pay | Admitting: Physician Assistant

## 2022-06-01 VITALS — BP 105/67 | HR 75 | Temp 97.8°F | Resp 12 | Ht 66.0 in | Wt 151.3 lb

## 2022-06-01 DIAGNOSIS — L659 Nonscarring hair loss, unspecified: Secondary | ICD-10-CM

## 2022-06-01 DIAGNOSIS — Z8349 Family history of other endocrine, nutritional and metabolic diseases: Secondary | ICD-10-CM | POA: Insufficient documentation

## 2022-06-01 DIAGNOSIS — Z0000 Encounter for general adult medical examination without abnormal findings: Secondary | ICD-10-CM | POA: Diagnosis not present

## 2022-06-01 NOTE — Assessment & Plan Note (Addendum)
Will check iron panel/cmp . If iron elevated ref to heme-- advised at some point it is probably best to screen for the genetic d/o regardless.

## 2022-06-01 NOTE — Progress Notes (Signed)
I,Sulibeya S Dimas,acting as a Neurosurgeon for [employer], PA-C.,have documented all relevant documentation on the behalf of Alfredia Ferguson, PA-C,as directed by  Alfredia Ferguson, PA-C while in the presence of Alfredia Ferguson, PA-C.  New patient visit   Patient: Elizabeth Kramer   DOB: 16-Jun-1985   37 y.o. Female  MRN: [id number] Visit Date: 06/01/2022  Today's healthcare provider: Alfredia Ferguson, PA-C   [email or web address]   Subjective    Elizabeth Kramer is a 37 y.o. female who presents today as a new patient to establish care.  HPI  Pt reports hair thinning since the birth of her last child x 5 years. Seems to be worsening. She takes a daily multivitamin w/ omega 3.   She has a family history of hemachromatosis.    Past Medical History:  Diagnosis Date   Preeclampsia in postpartum period    Past Surgical History:  Procedure Laterality Date   INTRAUTERINE DEVICE (IUD) INSERTION  02/2017   Mirena   NO PAST SURGERIES     Family Status  Relation Name Status   Mother  Alive   Sister  Alive   Daughter  Alive   Daughter  Alive   Son  Alive   Mat Uncle  (Not Specified)   MGF  (Not Specified)   PGM Lucille layell (Not Specified)   Family History  Problem Relation Age of Onset   Hemachromatosis Mother    Hemachromatosis Sister    Colon cancer Maternal Uncle    Lung cancer Maternal Grandfather    Diabetes Paternal Grandmother    Social History   Socioeconomic History   Marital status: Married    Spouse name: Not on file   Number of children: Not on file   Years of education: Not on file   Highest education level: Not on file  Occupational History   Not on file  Tobacco Use   Smoking status: Never   Smokeless tobacco: Never  Vaping Use   Vaping Use: Never used  Substance and Sexual Activity   Alcohol use: Never   Drug use: Never   Sexual activity: Yes    Birth control/protection: None    Comment: plans for IUD after delivery  Other Topics Concern   Not on file  Social  History Narrative   Not on file   Social Determinants of Health   Financial Resource Strain: Not on file  Food Insecurity: Not on file  Transportation Needs: Not on file  Physical Activity: Not on file  Stress: Not on file  Social Connections: Not on file   Outpatient Medications Prior to Visit  Medication Sig   [DISCONTINUED] norgestimate-ethinyl estradiol (ORTHO-CYCLEN) 0.25-35 MG-MCG tablet TAKE 1 TABLET BY MOUTH EVERY DAY (Patient not taking: Reported on 06/01/2022)   No facility-administered medications prior to visit.   Allergies  Allergen Reactions   Penicillins Rash    Immunization History  Administered Date(s) Administered   Influenza,inj,Quad PF,6+ Mos 10/14/2016, 10/04/2018, 10/14/2020   Moderna Sars-Covid-2 Vaccination 04/30/2019, 05/07/2019   Tdap 10/28/2016    Health Maintenance  Topic Date Due   Hepatitis C Screening  Never done   PAP SMEAR-Modifier  06/12/2021   COVID-19 Vaccine (3 - 2023-24 season) 09/03/2021   INFLUENZA VACCINE  08/04/2022   DTaP/Tdap/Td (2 - Td or Tdap) 10/29/2026   HIV Screening  Completed   HPV VACCINES  Aged Out    Patient Care Team: Alfredia Ferguson, PA-C as PCP - General (Physician Assistant)  Review of Systems  Eyes:  Positive  for redness.  Cardiovascular:  Positive for palpitations.  Musculoskeletal:  Positive for arthralgias and back pain.      Objective    BP 105/67 (BP Location: Right Arm, Patient Position: Sitting, Cuff Size: Large)   Pulse 75   Temp 97.8 F (36.6 C) (Temporal)   Resp 12   Ht 5\' 6"  (1.676 m)   Wt 151 lb 4.8 oz (68.6 kg)   LMP 05/22/2022 (Approximate)   SpO2 100%   BMI 24.42 kg/m  BP Readings from Last 3 Encounters:  06/01/22 105/67  10/14/20 100/60  07/31/19 125/78   Wt Readings from Last 3 Encounters:  06/01/22 151 lb 4.8 oz (68.6 kg)  10/14/20 144 lb (65.3 kg)  07/31/19 137 lb (62.1 kg)      Physical Exam Constitutional:      General: She is awake.     Appearance: She is  well-developed. She is not ill-appearing.  HENT:     Head: Normocephalic.     Right Ear: Tympanic membrane normal.     Left Ear: Tympanic membrane normal.     Nose: Nose normal. No congestion or rhinorrhea.     Mouth/Throat:     Pharynx: No oropharyngeal exudate or posterior oropharyngeal erythema.  Eyes:     Conjunctiva/sclera: Conjunctivae normal.     Pupils: Pupils are equal, round, and reactive to light.  Neck:     Thyroid: No thyroid mass or thyromegaly.  Cardiovascular:     Rate and Rhythm: Normal rate and regular rhythm.     Heart sounds: Normal heart sounds.  Pulmonary:     Effort: Pulmonary effort is normal.     Breath sounds: Normal breath sounds.  Abdominal:     Palpations: Abdomen is soft.     Tenderness: There is no abdominal tenderness.  Musculoskeletal:     Right lower leg: No swelling. No edema.     Left lower leg: No swelling. No edema.  Lymphadenopathy:     Cervical: No cervical adenopathy.  Skin:    General: Skin is warm.  Neurological:     Mental Status: She is alert and oriented to person, place, and time.  Psychiatric:        Attention and Perception: Attention normal.        Mood and Affect: Mood normal.        Speech: Speech normal.        Behavior: Behavior normal. Behavior is cooperative.     Depression Screen    06/01/2022    2:35 PM 06/13/2018    2:16 PM  PHQ 2/9 Scores  PHQ - 2 Score 1 0  PHQ- 9 Score 2 1   No results found for any visits on 06/01/22.  Assessment & Plan      Problem List Items Addressed This Visit       Other   Family history of hemochromatosis    Will check iron panel/cmp . If iron elevated ref to heme-- advised at some point it is probably best to screen for the genetic d/o regardless.      Relevant Orders   Iron, TIBC and Ferritin Panel   Other Visit Diagnoses     Annual physical exam    -  Primary   Relevant Orders   CBC w/Diff/Platelet   Comprehensive Metabolic Panel (CMET)   HgB A1c   Hair  thinning       Relevant Orders   TSH + free T4   Vitamin D (25 hydroxy)  2. Hair thinning Will check tsh/t4, vit d. Recommending rosemary oil otc.  - TSH + free T4 - Vitamin D (25 hydroxy)  Return in about 1 year (around 06/01/2023) for CPE.    I, Alfredia Ferguson, PA-C have reviewed all documentation for this visit. The documentation on  06/01/22   for the exam, diagnosis, procedures, and orders are all accurate and complete.   Alfredia Ferguson, PA-C [facility] [address] [place], [place], [address] Office: [phone] Fax: [phone]   [facility]

## 2022-06-02 ENCOUNTER — Other Ambulatory Visit: Payer: Self-pay

## 2022-06-02 DIAGNOSIS — Z8349 Family history of other endocrine, nutritional and metabolic diseases: Secondary | ICD-10-CM

## 2022-06-02 LAB — COMPREHENSIVE METABOLIC PANEL
ALT: 16 IU/L (ref 0–32)
AST: 20 IU/L (ref 0–40)
Albumin/Globulin Ratio: 1.8 (ref 1.2–2.2)
Albumin: 4.4 g/dL (ref 3.9–4.9)
Alkaline Phosphatase: 44 IU/L (ref 44–121)
BUN/Creatinine Ratio: 13 (ref 9–23)
BUN: 12 mg/dL (ref 6–20)
Bilirubin Total: 0.2 mg/dL (ref 0.0–1.2)
CO2: 24 mmol/L (ref 20–29)
Calcium: 9.4 mg/dL (ref 8.7–10.2)
Chloride: 102 mmol/L (ref 96–106)
Creatinine, Ser: 0.91 mg/dL (ref 0.57–1.00)
Globulin, Total: 2.4 g/dL (ref 1.5–4.5)
Glucose: 81 mg/dL (ref 70–99)
Potassium: 4.2 mmol/L (ref 3.5–5.2)
Sodium: 138 mmol/L (ref 134–144)
Total Protein: 6.8 g/dL (ref 6.0–8.5)
eGFR: 83 mL/min/{1.73_m2} (ref >59)

## 2022-06-02 LAB — IRON,TIBC AND FERRITIN PANEL
Ferritin: 11 ng/mL — ABNORMAL LOW (ref 15–150)
Iron Saturation: 15 % (ref 15–55)
Iron: 72 ug/dL (ref 27–159)
Total Iron Binding Capacity: 472 ug/dL — ABNORMAL HIGH (ref 250–450)
UIBC: 400 ug/dL (ref 131–425)

## 2022-06-02 LAB — VITAMIN D 25 HYDROXY (VIT D DEFICIENCY, FRACTURES): Vit D, 25-Hydroxy: 49.3 ng/mL (ref 30.0–100.0)

## 2022-06-02 LAB — CBC WITH DIFFERENTIAL/PLATELET
Basophils Absolute: 0 10*3/uL (ref 0.0–0.2)
Basos: 1 %
EOS (ABSOLUTE): 0.1 10*3/uL (ref 0.0–0.4)
Eos: 2 %
Hematocrit: 39.1 % (ref 34.0–46.6)
Hemoglobin: 12.9 g/dL (ref 11.1–15.9)
Immature Grans (Abs): 0 10*3/uL (ref 0.0–0.1)
Immature Granulocytes: 0 %
Lymphocytes Absolute: 2.6 10*3/uL (ref 0.7–3.1)
Lymphs: 33 %
MCH: 29.4 pg (ref 26.6–33.0)
MCHC: 33 g/dL (ref 31.5–35.7)
MCV: 89 fL (ref 79–97)
Monocytes Absolute: 0.5 10*3/uL (ref 0.1–0.9)
Monocytes: 7 %
Neutrophils Absolute: 4.6 10*3/uL (ref 1.4–7.0)
Neutrophils: 57 %
Platelets: 183 10*3/uL (ref 150–450)
RBC: 4.39 x10E6/uL (ref 3.77–5.28)
RDW: 12.6 % (ref 11.7–15.4)
WBC: 7.9 10*3/uL (ref 3.4–10.8)

## 2022-06-02 LAB — HEMOGLOBIN A1C
Est. average glucose Bld gHb Est-mCnc: 111 mg/dL
Hgb A1c MFr Bld: 5.5 % (ref 4.8–5.6)

## 2022-06-02 LAB — TSH+FREE T4
Free T4: 1.1 ng/dL (ref 0.82–1.77)
TSH: 1.02 u[IU]/mL (ref 0.450–4.500)

## 2022-07-01 ENCOUNTER — Ambulatory Visit: Payer: BC Managed Care – PPO | Admitting: Licensed Practical Nurse

## 2022-07-01 ENCOUNTER — Encounter: Payer: Self-pay | Admitting: Licensed Practical Nurse

## 2022-07-01 ENCOUNTER — Other Ambulatory Visit (HOSPITAL_COMMUNITY)
Admission: RE | Admit: 2022-07-01 | Discharge: 2022-07-01 | Disposition: A | Discharge status: Home / Self Care | Payer: BC Managed Care – PPO | Source: Ambulatory Visit | Attending: Licensed Practical Nurse | Admitting: Licensed Practical Nurse

## 2022-07-01 VITALS — BP 115/70 | HR 67 | Ht 66.0 in | Wt 150.0 lb

## 2022-07-01 DIAGNOSIS — Z124 Encounter for screening for malignant neoplasm of cervix: Secondary | ICD-10-CM | POA: Insufficient documentation

## 2022-07-01 DIAGNOSIS — Z01419 Encounter for gynecological examination (general) (routine) without abnormal findings: Secondary | ICD-10-CM | POA: Insufficient documentation

## 2022-07-01 NOTE — Progress Notes (Signed)
Gynecology Annual Exam   PCP: Alfredia Ferguson, PA-C  Chief Complaint:  Chief Complaint  Patient presents with   Gynecologic Exam    History of Present Illness: Patient is a 37 y.o. [id number] presents for annual exam. The patient has no complaints today. She stopped her birth control pills 2 months ago. She is considering another pregnancy.   LMP: Patient's last menstrual period was 06/13/2022 (approximate). Average Interval: regular, monthly Duration of flow:  5-7   days Heavy Menses: no Clots: yes, small  Intermenstrual Bleeding: no Postcoital Bleeding: no Dysmenorrhea: light cramping  The patient is sexually active with 1 female partner. She currently uses none for contraception. She denies dyspareunia.  The patient does perform self breast exams.  There is no notable family history of breast or ovarian cancer in her family. Uncle had colon Cancer at age 100   The patient wears seatbelts: yes.   The patient has regular exercise: no.  Used to walk 6 miles most days, has not recently as her kids are out of school   The patient denies current symptoms of depression.  Works as a Sales executive 2 days a week Lives with her husband, and children ages 71, 39 and 16 years old, feels safe Last Dental exam 8 months ago Wears contacts, last eye exam 6 months ago     Review of Systems: Review of Systems  Constitutional: Negative.   Eyes:        Irritation   Respiratory: Negative.    Cardiovascular: Negative.   Gastrointestinal: Negative.   Genitourinary: Negative.   Musculoskeletal: Negative.   Neurological: Negative.   Endo/Heme/Allergies: Negative.   Psychiatric/Behavioral: Negative.      Past Medical History:  Patient Active Problem List   Diagnosis Date Noted   Family history of hemochromatosis 06/01/2022   History of IUFD 10/14/2016    q4 week growth scans     Past Surgical History:  Past Surgical History:  Procedure Laterality Date   INTRAUTERINE DEVICE (IUD)  INSERTION  02/2017   Mirena   NO PAST SURGERIES      Gynecologic History:  Patient's last menstrual period was 06/13/2022 (approximate). Contraception: none Last Pap: Results were: NIL and HR HPV negative 2020  Obstetric History: [id number]  Family History:  Family History  Problem Relation Age of Onset   Hemachromatosis Mother    Hemachromatosis Sister    Colon cancer Maternal Uncle    Lung cancer Maternal Grandfather    Diabetes Paternal Grandmother     Social History:  Social History   Socioeconomic History   Marital status: Married    Spouse name: Not on file   Number of children: Not on file   Years of education: Not on file   Highest education level: Not on file  Occupational History   Not on file  Tobacco Use   Smoking status: Never   Smokeless tobacco: Never  Vaping Use   Vaping Use: Never used  Substance and Sexual Activity   Alcohol use: Never   Drug use: Never   Sexual activity: Yes    Birth control/protection: None    Comment: plans for IUD after delivery  Other Topics Concern   Not on file  Social History Narrative   Not on file   Social Determinants of Health   Financial Resource Strain: Not on file  Food Insecurity: Not on file  Transportation Needs: Not on file  Physical Activity: Not on file  Stress: Not on file  Social Connections: Not on file  Intimate Partner Violence: Not on file    Allergies:  Allergies  Allergen Reactions   Penicillins Rash    Medications: Prior to Admission medications   Not on File    Physical Exam Vitals: Blood pressure 115/70, pulse 67, height 5\' 6"  (1.676 m), weight 150 lb (68 kg), last menstrual period 06/13/2022, not currently breastfeeding.  General: NAD HEENT: normocephalic, anicteric Thyroid: no enlargement, no palpable nodules Pulmonary: No increased work of breathing, CTAB Cardiovascular: RRR, distal pulses 2+ Breast: Breast symmetrical, no tenderness, no palpable nodules or masses, no skin  or nipple retraction present, no nipple discharge.  No axillary or supraclavicular lymphadenopathy. Abdomen: NABS, soft, non-tender, non-distended.  Umbilicus without lesions.  No hepatomegaly, splenomegaly or masses palpable. No evidence of hernia  Genitourinary:  External: Normal external female genitalia.  Normal urethral meatus, normal Bartholin's and Skene's glands.    Vagina: Normal vaginal mucosa, no evidence of prolapse.  Good tone  Cervix: Grossly normal in appearance, light bleeding noted from both cervix and os   Uterus: Non-enlarged, mobile, normal contour.  No CMT  Adnexa: ovaries non-enlarged, no adnexal masses  Rectal: deferred  Lymphatic: no evidence of inguinal lymphadenopathy Extremities: no edema, erythema, or tenderness Neurologic: Grossly intact Psychiatric: mood appropriate, affect full    Assessment: 37 y.o. [id number] routine annual exam  Plan: Problem List Items Addressed This Visit   None   2) STI screening  wasoffered and declined  2)  ASCCP guidelines and rational discussed.  Patient opts for every 3 years screening interval  3) Contraception - the patient is currently using  none.  She is  considering pregnancy, will return for IUD if desired. Husband may get vasectomy once they decided their family is complete.  Recommend Folic Acid supplement in addition to daily multivitamin.   4) Routine healthcare maintenance including cholesterol, diabetes screening discussed managed by PCP  5) No follow-ups on file.  Carie Caddy, CNM   [facility] 07/01/2022, 8:46 AM

## 2022-07-05 LAB — CYTOLOGY - PAP
Comment: NEGATIVE
Diagnosis: NEGATIVE
High risk HPV: NEGATIVE

## 2023-04-15 ENCOUNTER — Ambulatory Visit: Payer: Self-pay
